# Patient Record
Sex: Male | Born: 1975 | Race: Black or African American | Hispanic: No | Marital: Single | Smoking: Current every day smoker
Health system: Southern US, Community
[De-identification: ages and names within clinical notes are randomized; demographics above are authoritative.]

---

## 2016-06-24 ENCOUNTER — Emergency Department (HOSPITAL_BASED_OUTPATIENT_CLINIC_OR_DEPARTMENT_OTHER)
Admission: EM | Admit: 2016-06-24 | Discharge: 2016-06-24 | Disposition: A | Discharge status: Home / Self Care | Payer: Self-pay | Attending: Emergency Medicine | Admitting: Emergency Medicine

## 2016-06-24 ENCOUNTER — Encounter (HOSPITAL_BASED_OUTPATIENT_CLINIC_OR_DEPARTMENT_OTHER): Payer: Self-pay | Admitting: *Deleted

## 2016-06-24 DIAGNOSIS — F17210 Nicotine dependence, cigarettes, uncomplicated: Secondary | ICD-10-CM | POA: Insufficient documentation

## 2016-06-24 DIAGNOSIS — R197 Diarrhea, unspecified: Secondary | ICD-10-CM | POA: Insufficient documentation

## 2016-06-24 DIAGNOSIS — R21 Rash and other nonspecific skin eruption: Secondary | ICD-10-CM | POA: Insufficient documentation

## 2016-06-24 LAB — CBG MONITORING, ED: GLUCOSE-CAPILLARY: 106 mg/dL — AB (ref 65–99)

## 2016-06-24 MED ORDER — ACETAMINOPHEN 325 MG PO TABS
650.0000 mg | ORAL_TABLET | Freq: Once | ORAL | Status: AC
  Administered 2016-06-24: 650 mg via ORAL
  Filled 2016-06-24: qty 2

## 2016-06-24 MED ORDER — TERBINAFINE HCL 250 MG PO TABS: 250.0000 mg | ORAL_TABLET | Freq: Every day | ORAL | 0 refills | Status: DC

## 2016-06-24 NOTE — ED Notes (Signed)
PA-C at bedside 

## 2016-06-24 NOTE — ED Triage Notes (Signed)
Pt reports he developed L foot fungus approx 3 wks ago and was treating it with athlete's foot cream on it. He also put yeast infection cream on it. Reports fungus has worsened and is on bil. Feet now. Denies fever, n/v/d. Reports clear drainage from area.

## 2016-06-24 NOTE — ED Provider Notes (Signed)
[facility]   CSN: [id number] Arrival date & time: 06/24/16  2130     History   Chief Complaint Chief Complaint  Patient presents with  . Skin Problem    bil feet    HPI Anthony Pope is a 40 y.o. male.  Anthony Pope is a 40 y.o. male with no pertinent medical history and new to the area presents to ED with complaint of b/l foot rash. He initially noticed rash approximately three weeks ago between his 4th and 5th digit of his left foot and it has progressively spread to the other interdigit spaces. He now reports a rash between his 4th and 5th digit of his right foot. Pain is severe enough that patient reports it is painful to walk. He reports associated clear discharge and some redness to the area. Prior to onset of rash patient states he was at a public pool. He also reports wearing socks and boots daily for work. He has tried OTC athlete's foot cream and yeast infection cream applied once daily for one week with minimal relief. He denies fever, N/V/D, numbness, or weakness. No h/o immuno-compromising conditions.      History reviewed. No pertinent past medical history.  There are no active problems to display for this patient.   History reviewed. No pertinent surgical history.     Home Medications    Prior to Admission medications   Medication Sig Start Date End Date Taking? Authorizing Provider  terbinafine (LAMISIL) 250 MG tablet Take 1 tablet (250 mg total) by mouth daily. 06/24/16   Lona Kettle, PA-C    Family History No family history on file.  Social History Social History  Substance Use Topics  . Smoking status: Current Every Day Smoker    Types: Cigarettes  . Smokeless tobacco: Never Used  . Alcohol use No     Allergies   Other   Review of Systems Review of Systems  Constitutional: Negative for fever.  Gastrointestinal: Positive for diarrhea, nausea and vomiting.  Musculoskeletal: Positive for gait  problem.  Skin: Positive for color change and rash.  Allergic/Immunologic: Negative for immunocompromised state.  Neurological: Negative for weakness and numbness.     Physical Exam Updated Vital Signs BP 127/97 (BP Location: Right Arm)   Pulse 62   Temp 98.4 F (36.9 C) (Oral)   Resp 14   Ht 6\' 1"  (1.854 m)   Wt 99.8 kg   SpO2 100%   BMI 29.03 kg/m   Physical Exam  Constitutional: He appears well-developed and well-nourished. No distress.  HENT:  Head: Normocephalic and atraumatic.  Eyes: Conjunctivae are normal. No scleral icterus.  Neck: Normal range of motion.  Cardiovascular: Normal rate, regular rhythm, normal heart sounds and intact distal pulses.   Pulmonary/Chest: Effort normal and breath sounds normal. No respiratory distress.  Abdominal: He exhibits no distension.  Musculoskeletal:  ROM, strength, sensation of ankles and feet intact b/l. 2+ DP pulses. Capillary refill <3 seconds.   Neurological: He is alert. He is not disoriented. GCS eye subscore is 4. GCS verbal subscore is 5. GCS motor subscore is 6.  Patient is able to ambulate.   Skin: Skin is warm and dry. He is not diaphoretic.  Maceration and scaling noted in interdigit web spaces of left foot. Mild TTP of plantar surface of left foot. No warmth, erythema, swelling, or purulent discharge appreciated. No streaking noted. No area of fluctuance.  Maceration of interdigit webspace of 4th/5th digit of right foot  noted. No warmth, erythema, swelling, or drainage noted.   Psychiatric: He has a normal mood and affect. His behavior is normal.     ED Treatments / Results  Labs (all labs ordered are listed, but only abnormal results are displayed) Labs Reviewed  CBG MONITORING, ED - Abnormal; Notable for the following:       Result Value   Glucose-Capillary 106 (*)    All other components within normal limits    EKG  EKG Interpretation None       Radiology No results found.  Procedures Procedures  (including critical care time)  Medications Ordered in ED Medications  acetaminophen (TYLENOL) tablet 650 mg (650 mg Oral Given 06/24/16 1040)     Initial Impression / Assessment and Plan / ED Course  I have reviewed the triage vital signs and the nursing notes.  Pertinent labs & imaging results that were available during my care of the patient were reviewed by me and considered in my medical decision making (see chart for details).  Clinical Course    Patient presents to ED rash on feet b/l. Patient is afebrile and non-toxic appearing in NAD. VSS. Concern for possible tinea infection. No evidence of secondary infection at this time. Neurovascularly intact. Will treat with PO anti-fungal medication. Pt instructed to keep the area dry. Contact precautions given. Follow up with PCP if sxs persist for possible referral to dermatology. Return precautions discussed. Patient voiced understanding and is agreeable. Pt is safe for discharge at this time.     Final Clinical Impressions(s) / ED Diagnoses   Final diagnoses:  Rash    New Prescriptions Discharge Medication List as of 06/24/2016 11:11 AM    START taking these medications   Details  terbinafine (LAMISIL) 250 MG tablet Take 1 tablet (250 mg total) by mouth daily., Starting Fri 06/24/2016, Print         Lona KettleAshley Laurel Asiah Browder, PA-C 06/24/16 1808    Melene Planan Floyd, DO 06/26/16 [id number]

## 2016-06-24 NOTE — Discharge Instructions (Signed)
Read the information below.  You are being prescribed an oral anti-fungal medication. Take as directed. You can continue topical antifungal twice daily to affected area.  It is very important to keep your feet clean and dry. Wash feet with warm soap and water. Dry thoroughly. Change socks regularly if they become wet. You can apply guaze between toes to help with moisture control.  You can take tylenol or motrin for pain relief.   Use the prescribed medication as directed.  Please discuss all new medications with your pharmacist.   It is important to establish a primary care doctor. I [facility]. Please call to schedule an appointment - you can talk with them regarding your elevated blood pressure and tobacco cessation.  If symptoms persist following treatment [facility], you may need a referral to dermatology.  You may return to the Emergency Department at any time for worsening condition or any new symptoms that concern you. Return to ED if you develop a fever or the area becomes red, swollen, have purulent drainage, or noticed red streaking up your leg.

## 2017-02-17 DIAGNOSIS — L03116 Cellulitis of left lower limb: Secondary | ICD-10-CM | POA: Insufficient documentation

## 2017-02-17 DIAGNOSIS — B353 Tinea pedis: Secondary | ICD-10-CM | POA: Insufficient documentation

## 2017-02-17 DIAGNOSIS — F17210 Nicotine dependence, cigarettes, uncomplicated: Secondary | ICD-10-CM | POA: Insufficient documentation

## 2017-02-18 ENCOUNTER — Encounter (HOSPITAL_COMMUNITY): Payer: Self-pay

## 2017-02-18 ENCOUNTER — Emergency Department (HOSPITAL_COMMUNITY)
Admission: EM | Admit: 2017-02-18 | Discharge: 2017-02-18 | Disposition: A | Discharge status: Home / Self Care | Payer: Self-pay | Attending: Emergency Medicine | Admitting: Emergency Medicine

## 2017-02-18 ENCOUNTER — Emergency Department (HOSPITAL_COMMUNITY): Payer: Self-pay

## 2017-02-18 DIAGNOSIS — B353 Tinea pedis: Secondary | ICD-10-CM

## 2017-02-18 DIAGNOSIS — L03116 Cellulitis of left lower limb: Secondary | ICD-10-CM

## 2017-02-18 DIAGNOSIS — M79672 Pain in left foot: Secondary | ICD-10-CM

## 2017-02-18 MED ORDER — CEPHALEXIN 250 MG PO CAPS
500.0000 mg | ORAL_CAPSULE | Freq: Once | ORAL | Status: AC
  Administered 2017-02-18: 500 mg via ORAL
  Filled 2017-02-18: qty 2

## 2017-02-18 MED ORDER — CEPHALEXIN 500 MG PO CAPS: 500.0000 mg | ORAL_CAPSULE | Freq: Four times a day (QID) | ORAL | 0 refills | Status: DC

## 2017-02-18 MED ORDER — TERBINAFINE HCL 1 % EX CREA: 1.0000 "application " | TOPICAL_CREAM | Freq: Two times a day (BID) | CUTANEOUS | 0 refills | Status: DC

## 2017-02-18 MED ORDER — NAPROXEN 250 MG PO TABS
500.0000 mg | ORAL_TABLET | Freq: Once | ORAL | Status: DC
  Filled 2017-02-18: qty 2

## 2017-02-18 NOTE — Discharge Instructions (Signed)
Please read and follow all provided instructions.  Your diagnoses today include:  1. Left foot pain   2. Cellulitis of left foot   3. Tinea pedis of left foot     Tests performed today include:  Vital signs. See below for your results today.   Medications prescribed:   Keflex (cephalexin) - antibiotic  You have been prescribed an antibiotic medicine: take the entire course of medicine even if you are feeling better. Stopping early can cause the antibiotic not to work.   Lamisil - topical medication for athlete's foot  Take any prescribed medications only as directed.   Home care instructions:  Follow any educational materials contained in this packet. Keep affected area above the level of your heart when possible. Wash area gently twice a day with warm soapy water. Do not apply alcohol or hydrogen peroxide. Cover the area if it draining or weeping.   Follow-up instructions: Please follow-up with your primary care provider in the next 1 week for further evaluation of your symptoms.   Return instructions:  Return to the Emergency Department if you have:  Fever  Worsening symptoms  Worsening pain  Worsening swelling  Redness of the skin that moves away from the affected area, especially if it streaks away from the affected area   Any other emergent concerns  Your vital signs today were: BP 126/73 (BP Location: Left Arm)    Pulse 75    Temp 97.6 F (36.4 C) (Oral)    Resp 18    SpO2 98%  If your blood pressure (BP) was elevated above 135/85 this visit, please have this repeated by your doctor within one month. --------------

## 2017-02-18 NOTE — ED Notes (Signed)
Pt went home without discharge paperwork or prescriptions, pt upset about not receiving pain medication, refused naproxen.

## 2017-02-18 NOTE — ED Provider Notes (Signed)
[facility] DEPT Provider Note   CSN: [id number] Arrival date & time: 02/17/17  2356     History   Chief Complaint Chief Complaint  Patient presents with  . Foot Pain    HPI Anthony Pope is a 41 y.o. male.  Patient presents with acute onset of left foot pain which started gradually and became much worse over the past 2 days. Patient has a small bump on his fourth toe. He has had some drainage from between his toes. He also states it has been draining from the wound on the fourth toe. Patient is unable to bear weight on his foot because of the pain. No history of gout or diabetes. No fevers. Pain is worse with movement.       History reviewed. No pertinent past medical history.  There are no active problems to display for this patient.   History reviewed. No pertinent surgical history.     Home Medications    Prior to Admission medications   Medication Sig Start Date End Date Taking? Authorizing Provider  terbinafine (LAMISIL) 250 MG tablet Take 1 tablet (250 mg total) by mouth daily. 06/24/16   Deborha Payment, PA-C    Family History History reviewed. No pertinent family history.  Social History Social History  Substance Use Topics  . Smoking status: Current Every Day Smoker    Types: Cigarettes  . Smokeless tobacco: Never Used  . Alcohol use No     Allergies   Other   Review of Systems Review of Systems  Constitutional: Negative for activity change.  Musculoskeletal: Positive for arthralgias. Negative for back pain, joint swelling and neck pain.  Skin: Positive for wound.  Neurological: Negative for weakness and numbness.     Physical Exam Updated Vital Signs BP 126/73 (BP Location: Left Arm)   Pulse 75   Temp 97.6 F (36.4 C) (Oral)   Resp 18   SpO2 98%   Physical Exam  Constitutional: He appears well-developed and well-nourished.  HENT:  Head: Normocephalic and atraumatic.  Eyes: Conjunctivae are normal.  Neck: Normal range of  motion. Neck supple.  Pulmonary/Chest: No respiratory distress.  Musculoskeletal:  Patient with small nondraining papule noted on the dorsum of the fourth toe. There is very light surrounding erythema on the toe and forefoot. Patient has a lot of pain with movement of the toes and palpation of this area. Mild tinea pedis noted between the toes. No active drainage or abscess noted.  Neurological: He is alert.  Skin: Skin is warm and dry.  Psychiatric: He has a normal mood and affect.  Nursing note and vitals reviewed.    ED Treatments / Results   Radiology Dg Foot Complete Left  Result Date: 02/18/2017 CLINICAL DATA:  41 year old male with left foot pain and swelling. Open wound at the fourth toe. EXAM: LEFT FOOT - COMPLETE 3+ VIEW COMPARISON:  None. FINDINGS: There is no acute fracture or dislocation. No bone erosion or periosteal reaction. No arthritic changes. The soft tissues appear unremarkable. No radiopaque foreign object or soft tissue gas. IMPRESSION: Negative. Electronically Signed   By: Elgie Collard M.D.   On: 02/18/2017 06:27    Procedures Procedures (including critical care time)  Medications Ordered in ED Medications  naproxen (NAPROSYN) tablet 500 mg (500 mg Oral Not Given 02/18/17 0622)  cephALEXin (KEFLEX) capsule 500 mg (500 mg Oral Given 02/18/17 4098)     Initial Impression / Assessment and Plan / ED Course  I have reviewed the triage  vital signs and the nursing notes.  Pertinent labs & imaging results that were available during my care of the patient were reviewed by me and considered in my medical decision making (see chart for details).     Patient seen and examined. No history of inflammatory arthritis. This may be a secondary cellulitis related to his athlete's foot. Given amount of pain out of proportion to exam, will check x-ray to ensure no gas in the soft tissues or fracture. Will treat with Keflex and naproxen in the emergency department.   Vital  signs reviewed and are as follows: BP 126/73 (BP Location: Left Arm)   Pulse 75   Temp 97.6 F (36.4 C) (Oral)   Resp 18   SpO2 98%   6:55 AM Called to room by RN after x-ray. Patient is angry that I ordered NSAID. States that he has been here for 8 hours and he wants more than ibuprofen. I attempted to calmly explain why an anti-inflammatory is needed for his current condition but he wouldn't allow me to explain and walked out of the room and down the hall. I offered him antibiotic for his infection and he stated he will wait for that.   Patient did not allow me to offer crutches/post-op shoe, check blood sugar to ensure no DM.   Pt urged to return with worsening pain, worsening swelling, expanding area of redness or streaking up extremity, fever, or any other concerns. Urged to take complete course of antibiotics as prescribed.  Pt verbalizes understanding and agrees with plan.   Final Clinical Impressions(s) / ED Diagnoses   Final diagnoses:  Left foot pain  Cellulitis of left foot  Tinea pedis of left foot   Patient with left foot swelling and pain. I suspect that this is most likely secondary infection from the tinea that he has. He will go home with Keflex and Lamisil. Low suspicion for necrotizing fasciitis, x-rays reassuring without tissue gas. Unfortunately, patient became very upset when I ordered NSAID for his inflammatory condition. He would not accept an explanation or other methods of pain control including crutches and postop shoe. He did allow me to print prescriptions and instructions for him.   New Prescriptions Discharge Medication List as of 02/18/2017  6:44 AM    START taking these medications   Details  cephALEXin (KEFLEX) 500 MG capsule Take 1 capsule (500 mg total) by mouth 4 (four) times daily., Starting Sat 02/18/2017, Print    terbinafine (LAMISIL AT) 1 % cream Apply 1 application topically 2 (two) times daily., Starting Sat 02/18/2017, Print         Renne Crigler, PA-C 02/18/17 0700    Pricilla Loveless, MD 02/18/17 2205

## 2017-02-18 NOTE — ED Triage Notes (Signed)
Pt complaining of L foot pain and swelling. Pt with open wound to L 4th toe. Pt states some pus drainage yesterday. Pt with skin breakdown between toes of L foot. Pt states first noticed yesterday.

## 2017-04-03 ENCOUNTER — Encounter (HOSPITAL_COMMUNITY): Payer: Self-pay | Admitting: Emergency Medicine

## 2017-04-03 ENCOUNTER — Emergency Department (HOSPITAL_COMMUNITY): Payer: Self-pay

## 2017-04-03 ENCOUNTER — Emergency Department (HOSPITAL_COMMUNITY)
Admission: EM | Admit: 2017-04-03 | Discharge: 2017-04-03 | Disposition: A | Discharge status: Home / Self Care | Payer: Self-pay | Attending: Emergency Medicine | Admitting: Emergency Medicine

## 2017-04-03 DIAGNOSIS — F17210 Nicotine dependence, cigarettes, uncomplicated: Secondary | ICD-10-CM | POA: Insufficient documentation

## 2017-04-03 DIAGNOSIS — R1032 Left lower quadrant pain: Secondary | ICD-10-CM | POA: Insufficient documentation

## 2017-04-03 LAB — URINALYSIS, ROUTINE W REFLEX MICROSCOPIC
Bilirubin Urine: NEGATIVE
GLUCOSE, UA: NEGATIVE mg/dL
Hgb urine dipstick: NEGATIVE
KETONES UR: NEGATIVE mg/dL
NITRITE: NEGATIVE
PROTEIN: 30 mg/dL — AB
Specific Gravity, Urine: 1.017 (ref 1.005–1.030)
pH: 7 (ref 5.0–8.0)

## 2017-04-03 LAB — GC/CHLAMYDIA PROBE AMP (~~LOC~~) NOT AT ARMC
CHLAMYDIA, DNA PROBE: NEGATIVE
Neisseria Gonorrhea: NEGATIVE

## 2017-04-03 LAB — BASIC METABOLIC PANEL
Anion gap: 6 (ref 5–15)
BUN: 6 mg/dL (ref 6–20)
CALCIUM: 9 mg/dL (ref 8.9–10.3)
CHLORIDE: 104 mmol/L (ref 101–111)
CO2: 27 mmol/L (ref 22–32)
CREATININE: 1.25 mg/dL — AB (ref 0.61–1.24)
GFR calc Af Amer: >60 mL/min (ref >60)
GFR calc non Af Amer: >60 mL/min (ref >60)
GLUCOSE: 111 mg/dL — AB (ref 65–99)
Potassium: 3.5 mmol/L (ref 3.5–5.1)
Sodium: 137 mmol/L (ref 135–145)

## 2017-04-03 LAB — CBC WITH DIFFERENTIAL/PLATELET
BASOS PCT: 0 %
Basophils Absolute: 0 10*3/uL (ref 0.0–0.1)
Eosinophils Absolute: 0.1 10*3/uL (ref 0.0–0.7)
Eosinophils Relative: 1 %
HEMATOCRIT: 39.4 % (ref 39.0–52.0)
HEMOGLOBIN: 13.7 g/dL (ref 13.0–17.0)
LYMPHS ABS: 1.7 10*3/uL (ref 0.7–4.0)
LYMPHS PCT: 17 %
MCH: 31.4 pg (ref 26.0–34.0)
MCHC: 34.8 g/dL (ref 30.0–36.0)
MCV: 90.4 fL (ref 78.0–100.0)
MONO ABS: 0.4 10*3/uL (ref 0.1–1.0)
MONOS PCT: 4 %
NEUTROS ABS: 8.1 10*3/uL — AB (ref 1.7–7.7)
NEUTROS PCT: 78 %
Platelets: 277 10*3/uL (ref 150–400)
RBC: 4.36 MIL/uL (ref 4.22–5.81)
RDW: 13.1 % (ref 11.5–15.5)
WBC: 10.3 10*3/uL (ref 4.0–10.5)

## 2017-04-03 MED ORDER — TRAMADOL HCL 50 MG PO TABS: 50.0000 mg | ORAL_TABLET | Freq: Four times a day (QID) | ORAL | 0 refills | Status: DC | PRN

## 2017-04-03 MED ORDER — KETOROLAC TROMETHAMINE 60 MG/2ML IM SOLN
60.0000 mg | Freq: Once | INTRAMUSCULAR | Status: AC
  Administered 2017-04-03: 60 mg via INTRAMUSCULAR
  Filled 2017-04-03: qty 2

## 2017-04-03 MED ORDER — CYCLOBENZAPRINE HCL 10 MG PO TABS: 10.0000 mg | ORAL_TABLET | Freq: Three times a day (TID) | ORAL | 0 refills | Status: DC | PRN

## 2017-04-03 MED ORDER — KETOROLAC TROMETHAMINE 30 MG/ML IJ SOLN
30.0000 mg | Freq: Once | INTRAMUSCULAR | Status: DC
  Filled 2017-04-03: qty 1

## 2017-04-03 MED ORDER — NAPROXEN 500 MG PO TABS: 500.0000 mg | ORAL_TABLET | Freq: Two times a day (BID) | ORAL | 0 refills | Status: DC

## 2017-04-03 MED ORDER — OXYCODONE-ACETAMINOPHEN 5-325 MG PO TABS
2.0000 | ORAL_TABLET | Freq: Once | ORAL | Status: AC
  Administered 2017-04-03: 2 via ORAL
  Filled 2017-04-03: qty 2
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
## 2017-04-20 ENCOUNTER — Encounter (HOSPITAL_COMMUNITY): Payer: Self-pay | Admitting: Emergency Medicine

## 2017-04-20 DIAGNOSIS — F17210 Nicotine dependence, cigarettes, uncomplicated: Secondary | ICD-10-CM | POA: Insufficient documentation

## 2017-04-20 DIAGNOSIS — R51 Headache: Secondary | ICD-10-CM | POA: Insufficient documentation

## 2017-04-20 MED ORDER — IBUPROFEN 400 MG PO TABS
400.0000 mg | ORAL_TABLET | Freq: Once | ORAL | Status: AC
  Administered 2017-04-20: 400 mg via ORAL

## 2017-04-20 MED ORDER — IBUPROFEN 400 MG PO TABS
ORAL_TABLET | ORAL | Status: AC
  Filled 2017-04-20: qty 1

## 2017-04-20 NOTE — ED Triage Notes (Signed)
Pt presents to ED for assessment of left head pain x 4 days, no relief with ibuprofen or narcotics at home.  Pt c/o pain radiating from nose up to above his left eyebrow, behind his left ear and down his neck.  Pt denies injury.  C/o dizziness when bending over or looking down.  No neuro deficits noted.

## 2017-04-21 ENCOUNTER — Emergency Department (HOSPITAL_COMMUNITY)
Admission: EM | Admit: 2017-04-21 | Discharge: 2017-04-21 | Disposition: A | Discharge status: Home / Self Care | Payer: Self-pay | Attending: Emergency Medicine | Admitting: Emergency Medicine

## 2017-04-21 ENCOUNTER — Encounter (HOSPITAL_COMMUNITY): Payer: Self-pay | Admitting: *Deleted

## 2017-04-21 ENCOUNTER — Emergency Department (HOSPITAL_COMMUNITY): Payer: Self-pay

## 2017-04-21 DIAGNOSIS — R51 Headache: Secondary | ICD-10-CM

## 2017-04-21 DIAGNOSIS — R519 Headache, unspecified: Secondary | ICD-10-CM

## 2017-04-21 MED ORDER — DIPHENHYDRAMINE HCL 50 MG/ML IJ SOLN
25.0000 mg | Freq: Once | INTRAMUSCULAR | Status: AC
  Administered 2017-04-21: 25 mg via INTRAVENOUS
  Filled 2017-04-21: qty 1

## 2017-04-21 MED ORDER — SODIUM CHLORIDE 0.9 % IV BOLUS (SEPSIS)
1000.0000 mL | Freq: Once | INTRAVENOUS | Status: AC
Start: 2017-04-21 — End: 2017-04-21
  Administered 2017-04-21: 1000 mL via INTRAVENOUS

## 2017-04-21 MED ORDER — METOCLOPRAMIDE HCL 5 MG/ML IJ SOLN
10.0000 mg | Freq: Once | INTRAMUSCULAR | Status: AC
  Administered 2017-04-21: 10 mg via INTRAVENOUS
  Filled 2017-04-21: qty 2

## 2017-04-21 MED ORDER — KETOROLAC TROMETHAMINE 15 MG/ML IJ SOLN
15.0000 mg | Freq: Once | INTRAMUSCULAR | Status: AC
  Administered 2017-04-21: 15 mg via INTRAVENOUS
  Filled 2017-04-21: qty 1

## 2017-04-21 NOTE — ED Notes (Signed)
Patient transported to CT 

## 2017-04-21 NOTE — ED Provider Notes (Signed)
[facility] Provider Note   CSN: [id number] Arrival date & time: 04/20/17  2336     History   Chief Complaint Chief Complaint  Patient presents with  . Headache    HPI Anthony Pope is a 41 y.o. male.  Patient presents to the emergency department with chief complaint of headache. He denies any other past medical history. He denies any history of headaches. He states this headache began about 4 days ago. It has been constant and daily.  It is worsened with exposure to light and loud noises. He describes a stabbing sensation behind his left eye. He denies any fevers, chills, vision changes, slurred speech, numbness, weakness, or tingling of his extremities. He has been taking OTC medications as well as some Percocet with no relief. There are no other associated symptoms or modifying factors.   The history is provided by the patient. No language interpreter was used.    History reviewed. No pertinent past medical history.  There are no active problems to display for this patient.   History reviewed. No pertinent surgical history.     Home Medications    Prior to Admission medications   Medication Sig Start Date End Date Taking? Authorizing Provider  cephALEXin (KEFLEX) 500 MG capsule Take 1 capsule (500 mg total) by mouth 4 (four) times daily. 02/18/17   Renne Crigler, PA-C  cyclobenzaprine (FLEXERIL) 10 MG tablet Take 1 tablet (10 mg total) by mouth 3 (three) times daily as needed for muscle spasms. 04/03/17   Gilda Crease, MD  naproxen (NAPROSYN) 500 MG tablet Take 1 tablet (500 mg total) by mouth 2 (two) times daily. 04/03/17   Gilda Crease, MD  terbinafine (LAMISIL AT) 1 % cream Apply 1 application topically 2 (two) times daily. 02/18/17   Renne Crigler, PA-C  terbinafine (LAMISIL) 250 MG tablet Take 1 tablet (250 mg total) by mouth daily. 06/24/16   Deborha Payment, PA-C  traMADol (ULTRAM) 50 MG tablet Take 1 tablet (50 mg total) by mouth every  6 (six) hours as needed. 04/03/17   Gilda Crease, MD    Family History History reviewed. No pertinent family history.  Social History Social History  Substance Use Topics  . Smoking status: Current Every Day Smoker    Packs/day: 0.50    Types: Cigarettes  . Smokeless tobacco: Never Used  . Alcohol use No     Allergies   Other   Review of Systems Review of Systems  All other systems reviewed and are negative.    Physical Exam Updated Vital Signs BP 106/78   Pulse 65   Temp 98.1 F (36.7 C) (Oral)   Resp 18   SpO2 98%   Physical Exam  Constitutional: He is oriented to person, place, and time. He appears well-developed and well-nourished.  HENT:  Head: Normocephalic and atraumatic.  Right Ear: External ear normal.  Left Ear: External ear normal.  Eyes: Conjunctivae and EOM are normal. Pupils are equal, round, and reactive to light. Right eye exhibits no discharge. Left eye exhibits no discharge. No scleral icterus.  Neck: Normal range of motion. Neck supple. No JVD present.  No pain with neck flexion, no meningismus  Cardiovascular: Normal rate, regular rhythm and normal heart sounds.  Exam reveals no gallop and no friction rub.   No murmur heard. Pulmonary/Chest: Effort normal and breath sounds normal. No respiratory distress. He has no wheezes. He has no rales. He exhibits no tenderness.  Abdominal: Soft. He exhibits no  distension and no mass. There is no tenderness. There is no rebound and no guarding.  Musculoskeletal: Normal range of motion. He exhibits no edema or tenderness.  Normal gait.  Neurological: He is alert and oriented to person, place, and time. He has normal reflexes.  CN 3-12 intact, normal finger to nose, no pronator drift, sensation and strength intact bilaterally.  Skin: Skin is warm and dry.  Psychiatric: He has a normal mood and affect. His behavior is normal. Judgment and thought content normal.  Nursing note and vitals  reviewed.    ED Treatments / Results  Labs (all labs ordered are listed, but only abnormal results are displayed) Labs Reviewed - No data to display  EKG  EKG Interpretation None       Radiology No results found.  Procedures Procedures (including critical care time)  Medications Ordered in ED Medications  ibuprofen (ADVIL,MOTRIN) 400 MG tablet (not administered)  ketorolac (TORADOL) 15 MG/ML injection 15 mg (not administered)  metoCLOPramide (REGLAN) injection 10 mg (not administered)  diphenhydrAMINE (BENADRYL) injection 25 mg (not administered)  sodium chloride 0.9 % bolus 1,000 mL (not administered)  ibuprofen (ADVIL,MOTRIN) tablet 400 mg (400 mg Oral Given 04/20/17 2351)     Initial Impression / Assessment and Plan / ED Course  I have reviewed the triage vital signs and the nursing notes.  Pertinent labs & imaging results that were available during my care of the patient were reviewed by me and considered in my medical decision making (see chart for details).     Pt HA treated and improved while in ED.  Presentation non concerning for [facility], ICH, Meningitis, or temporal arteritis.  CT ordered to rule out mass. Pt is afebrile with no focal neuro deficits, nuchal rigidity, or change in vision. Pt is to follow up with PCP to discuss prophylactic medication. Pt verbalizes understanding and is agreeable with plan to dc.    Final Clinical Impressions(s) / ED Diagnoses   Final diagnoses:  Acute nonintractable headache, unspecified headache type    New Prescriptions New Prescriptions   No medications on file     Roxy HorsemanBrowning, Calix Heinbaugh, Cordelia Poche-C 04/21/17 0446    Zadie RhineWickline, Donald, MD 04/21/17 [phone]

## 2017-04-21 NOTE — ED Notes (Signed)
Pt called for again for vital sign reassessment. No answer x3. 

## 2017-04-21 NOTE — ED Notes (Signed)
Pt called for in waiting area for vital sign reassessment. No answer x3. 

## 2017-04-23 ENCOUNTER — Encounter (HOSPITAL_COMMUNITY): Payer: Self-pay | Admitting: Emergency Medicine

## 2017-04-23 ENCOUNTER — Emergency Department (HOSPITAL_COMMUNITY)
Admission: EM | Admit: 2017-04-23 | Discharge: 2017-04-23 | Disposition: A | Discharge status: Home / Self Care | Payer: Self-pay | Attending: Emergency Medicine | Admitting: Emergency Medicine

## 2017-04-23 DIAGNOSIS — F17210 Nicotine dependence, cigarettes, uncomplicated: Secondary | ICD-10-CM | POA: Insufficient documentation

## 2017-04-23 DIAGNOSIS — N189 Chronic kidney disease, unspecified: Secondary | ICD-10-CM | POA: Insufficient documentation

## 2017-04-23 DIAGNOSIS — N289 Disorder of kidney and ureter, unspecified: Secondary | ICD-10-CM

## 2017-04-23 DIAGNOSIS — D649 Anemia, unspecified: Secondary | ICD-10-CM | POA: Insufficient documentation

## 2017-04-23 DIAGNOSIS — G43009 Migraine without aura, not intractable, without status migrainosus: Secondary | ICD-10-CM | POA: Insufficient documentation

## 2017-04-23 LAB — BASIC METABOLIC PANEL
Anion gap: 5 (ref 5–15)
BUN: 10 mg/dL (ref 6–20)
CALCIUM: 8.3 mg/dL — AB (ref 8.9–10.3)
CHLORIDE: 108 mmol/L (ref 101–111)
CO2: 24 mmol/L (ref 22–32)
CREATININE: 1.34 mg/dL — AB (ref 0.61–1.24)
GFR calc non Af Amer: >60 mL/min (ref >60)
Glucose, Bld: 100 mg/dL — ABNORMAL HIGH (ref 65–99)
Potassium: 3.5 mmol/L (ref 3.5–5.1)
SODIUM: 137 mmol/L (ref 135–145)

## 2017-04-23 LAB — CBC WITH DIFFERENTIAL/PLATELET
BASOS PCT: 0 %
Basophils Absolute: 0 10*3/uL (ref 0.0–0.1)
EOS ABS: 0.2 10*3/uL (ref 0.0–0.7)
EOS PCT: 2 %
HCT: 35.7 % — ABNORMAL LOW (ref 39.0–52.0)
Hemoglobin: 12 g/dL — ABNORMAL LOW (ref 13.0–17.0)
LYMPHS ABS: 2.1 10*3/uL (ref 0.7–4.0)
Lymphocytes Relative: 21 %
MCH: 30.5 pg (ref 26.0–34.0)
MCHC: 33.6 g/dL (ref 30.0–36.0)
MCV: 90.6 fL (ref 78.0–100.0)
Monocytes Absolute: 0.5 10*3/uL (ref 0.1–1.0)
Monocytes Relative: 5 %
NEUTROS PCT: 72 %
Neutro Abs: 7.2 10*3/uL (ref 1.7–7.7)
PLATELETS: 229 10*3/uL (ref 150–400)
RBC: 3.94 MIL/uL — AB (ref 4.22–5.81)
RDW: 13 % (ref 11.5–15.5)
WBC: 10.1 10*3/uL (ref 4.0–10.5)

## 2017-04-23 MED ORDER — PROCHLORPERAZINE EDISYLATE 5 MG/ML IJ SOLN
10.0000 mg | INTRAMUSCULAR | Status: AC
  Administered 2017-04-23: 10 mg via INTRAVENOUS
  Filled 2017-04-23: qty 2

## 2017-04-23 MED ORDER — SODIUM CHLORIDE 0.9 % IV BOLUS (SEPSIS)
1000.0000 mL | Freq: Once | INTRAVENOUS | Status: AC
Start: 2017-04-23 — End: 2017-04-23
  Administered 2017-04-23: 1000 mL via INTRAVENOUS

## 2017-04-23 MED ORDER — TETRACAINE HCL 0.5 % OP SOLN
2.0000 [drp] | Freq: Once | OPHTHALMIC | Status: AC
  Administered 2017-04-23: 2 [drp] via OPHTHALMIC
  Filled 2017-04-23: qty 4

## 2017-04-23 MED ORDER — KETOROLAC TROMETHAMINE 60 MG/2ML IM SOLN
60.0000 mg | Freq: Once | INTRAMUSCULAR | Status: AC
  Administered 2017-04-23: 60 mg via INTRAMUSCULAR
  Filled 2017-04-23: qty 2

## 2017-04-23 MED ORDER — PROCHLORPERAZINE MALEATE 10 MG PO TABS: 10.0000 mg | ORAL_TABLET | Freq: Four times a day (QID) | ORAL | 0 refills | Status: DC | PRN

## 2017-04-23 MED ORDER — PREDNISONE 20 MG PO TABS: ORAL_TABLET | ORAL | 0 refills | Status: DC

## 2017-04-23 MED ORDER — DIPHENHYDRAMINE HCL 50 MG/ML IJ SOLN
25.0000 mg | Freq: Once | INTRAMUSCULAR | Status: AC
  Administered 2017-04-23: 25 mg via INTRAVENOUS
  Filled 2017-04-23: qty 1

## 2017-04-23 MED ORDER — DEXAMETHASONE SODIUM PHOSPHATE 10 MG/ML IJ SOLN
10.0000 mg | Freq: Once | INTRAMUSCULAR | Status: AC
  Administered 2017-04-23: 10 mg via INTRAVENOUS
  Filled 2017-04-23: qty 1

## 2017-04-23 NOTE — ED Triage Notes (Signed)
Pt here 3 days ago for headache, headache gotten worse all in the L side to jaw, BP 172/80, Pulse 48, pt does have nausea and cannot keep food down.

## 2017-04-23 NOTE — ED Provider Notes (Signed)
[facility] Provider Note   CSN: [id number] Arrival date & time: 04/23/17  0016  By signing my name below, I, Phillips Climes, attest that this documentation has been prepared under the direction and in the presence of Dione Booze, MD . Electronically Signed: Phillips Climes, Scribe. 04/23/2017. 12:42 AM.  History   Chief Complaint Chief Complaint  Patient presents with  . Headache   HPI Comments  Anthony Pope is a 41 y.o. male with no reported PMHx, who presents to the Emergency Department with complaints of a constant headache, L>R x3 days.  He rates this a 10/10 in severity.  Associated photophobia, nausea and vomiting.  Reported left eye with blurry vision.  Pt states that he "does not believe this is a headache."  Trouble sleeping 2/2 discomfort.  He experienced no symptomatic relief with naproxen or tylenol.  Pt was seen with identical sx and normal CT head on 04/21/2017, x2 days ago.  He reports outpatient f/u with neurology already scheduled, however he is unable to recall any details.  Per chart, pt to schedule f/u with [facility].  He denies experiencing any other acute sx.  The history is provided by the patient and medical records. No language interpreter was used.    History reviewed. No pertinent past medical history.  There are no active problems to display for this patient.   History reviewed. No pertinent surgical history.     Home Medications    Prior to Admission medications   Medication Sig Start Date End Date Taking? Authorizing Provider  cephALEXin (KEFLEX) 500 MG capsule Take 1 capsule (500 mg total) by mouth 4 (four) times daily. 02/18/17   Renne Crigler, PA-C  cyclobenzaprine (FLEXERIL) 10 MG tablet Take 1 tablet (10 mg total) by mouth 3 (three) times daily as needed for muscle spasms. 04/03/17   Gilda Crease, MD  naproxen (NAPROSYN) 500 MG tablet Take 1 tablet (500 mg total) by mouth 2 (two) times daily. 04/03/17    Gilda Crease, MD  terbinafine (LAMISIL AT) 1 % cream Apply 1 application topically 2 (two) times daily. 02/18/17   Renne Crigler, PA-C  terbinafine (LAMISIL) 250 MG tablet Take 1 tablet (250 mg total) by mouth daily. 06/24/16   Deborha Payment, PA-C  traMADol (ULTRAM) 50 MG tablet Take 1 tablet (50 mg total) by mouth every 6 (six) hours as needed. 04/03/17   Gilda Crease, MD    Family History No family history on file.  Social History Social History  Substance Use Topics  . Smoking status: Current Every Day Smoker    Packs/day: 0.50    Types: Cigarettes  . Smokeless tobacco: Never Used  . Alcohol use No     Allergies   Other   Review of Systems Review of Systems  Eyes: Positive for photophobia and visual disturbance.  Gastrointestinal: Positive for nausea and vomiting.  Neurological: Positive for headaches.  All other systems reviewed and are negative.  Physical Exam Updated Vital Signs BP (!) 130/92 (BP Location: Right Arm)   Pulse (!) 57   Temp 98.4 F (36.9 C) (Oral)   Resp 16   Ht 6\' 1"  (1.854 m)   Wt 215 lb (97.5 kg)   SpO2 99%   BMI 28.37 kg/m   Physical Exam  Constitutional: He is oriented to person, place, and time. He appears well-developed and well-nourished.  Appears uncomfortable.  HENT:  Head: Normocephalic and atraumatic.  Eyes: EOM are normal. Pupils are equal,  round, and reactive to light.  Photophobia present.  Neck: Normal range of motion. Neck supple. No JVD present.  Cardiovascular: Normal rate, regular rhythm and normal heart sounds.   No murmur heard. Pulmonary/Chest: Effort normal and breath sounds normal. He has no wheezes. He has no rales. He exhibits no tenderness.  Abdominal: Soft. Bowel sounds are normal. He exhibits no distension and no mass. There is no tenderness.  Musculoskeletal: Normal range of motion. He exhibits no edema.  Lymphadenopathy:    He has no cervical adenopathy.  Neurological: He is alert and  oriented to person, place, and time. No cranial nerve deficit. He exhibits normal muscle tone. Coordination normal.  Skin: Skin is warm and dry. No rash noted.  Psychiatric: He has a normal mood and affect. His behavior is normal. Judgment and thought content normal.  Nursing note and vitals reviewed.  ED Treatments / Results  DIAGNOSTIC STUDIES: Oxygen Saturation is 99% on room air, normal by my interpretation.    COORDINATION OF CARE: 12:27 AM Discussed treatment plan with pt at bedside and pt agreed to plan.  Labs (all labs ordered are listed, but only abnormal results are displayed) Labs Reviewed  BASIC METABOLIC PANEL  CBC WITH DIFFERENTIAL/PLATELET    EKG  EKG Interpretation None      Radiology Ct Head Wo Contrast  Result Date: 04/21/2017 CLINICAL DATA:  Headache for 4 days, behind the left thigh in extending into the neck. EXAM: CT HEAD WITHOUT CONTRAST TECHNIQUE: Contiguous axial images were obtained from the base of the skull through the vertex without intravenous contrast. COMPARISON:  None. FINDINGS: Brain: There is no intracranial hemorrhage, mass or evidence of acute infarction. There is no extra-axial fluid collection. Gray matter and white matter appear normal. Cerebral volume is normal for age. Brainstem and posterior fossa are unremarkable. The CSF spaces appear normal. Vascular: No hyperdense vessel or unexpected calcification. Skull: Normal. Negative for fracture or focal lesion. Sinuses/Orbits: 12 mm retention cyst at the medial aspect of the left maxillary sinus, incompletely imaged. The other paranasal sinuses are normal where seen. Other: None. IMPRESSION: Normal brain Electronically Signed   By: Ellery Plunk M.D.   On: 04/21/2017 04:22    Procedures Procedures (including critical care time)  Medications Ordered in ED Medications  sodium chloride 0.9 % bolus 1,000 mL (not administered)  prochlorperazine (COMPAZINE) injection 10 mg (not administered)    ketorolac (TORADOL) injection 60 mg (not administered)  diphenhydrAMINE (BENADRYL) injection 25 mg (not administered)  dexamethasone (DECADRON) injection 10 mg (not administered)  tetracaine (PONTOCAINE) 0.5 % ophthalmic solution 2 drop (not administered)     Initial Impression / Assessment and Plan / ED Course  I have reviewed the triage vital signs and the nursing notes.  Pertinent lab results that were available during my care of the patient were reviewed by me and considered in my medical decision making (see chart for details).  Hemicranial headache which certainly seems consistent with migraine headache. Review of old records shows that he had been in the ED 2 days ago with headache. Notes say that he had relief from headache, but patient states that he did not get relief. Patient states he's never had problems with headache, but a prior ED visit had headaches listed in his review of systems. He'll be given a migraine cocktail of normal saline, focal posterior in, ketorolac, diphenhydramine, and dexamethasone. Will also check ocular pressures to make sure that this is not glaucoma.  He had excellent relief of his  headache with above noted treatment. Ocular pressure is 20 mmHg, which is normal. He is discharged with prescriptions for prednisone and prochlorperazine. Of note, laboratory evaluation shows hemoglobin has decreased from 1 week ago. There is no history of bleeding and it isn't clear what this is from. Mild renal insufficiency is present - unchanged from baseline.  Final Clinical Impressions(s) / ED Diagnoses   Final diagnoses:  Migraine without aura and without status migrainosus, not intractable  Renal insufficiency  Normochromic normocytic anemia    New Prescriptions New Prescriptions   PREDNISONE (DELTASONE) 20 MG TABLET    2 tabs po daily x 4 days   PROCHLORPERAZINE (COMPAZINE) 10 MG TABLET    Take 1 tablet (10 mg total) by mouth every 6 (six) hours as needed for  nausea or vomiting (or headache).     Dione BoozeGlick, Ameah Chanda, MD 04/23/17 [phone]

## 2017-04-23 NOTE — Discharge Instructions (Signed)
You may take ibuprofen or naproxen as needed for headache.
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
## 2017-12-07 ENCOUNTER — Other Ambulatory Visit: Payer: Self-pay

## 2017-12-07 ENCOUNTER — Emergency Department (HOSPITAL_COMMUNITY)
Admission: EM | Admit: 2017-12-07 | Discharge: 2017-12-07 | Discharge status: Against Medical Advice | Payer: Self-pay | Attending: Emergency Medicine | Admitting: Emergency Medicine

## 2017-12-07 ENCOUNTER — Encounter (HOSPITAL_COMMUNITY): Payer: Self-pay | Admitting: Emergency Medicine

## 2017-12-07 DIAGNOSIS — F17210 Nicotine dependence, cigarettes, uncomplicated: Secondary | ICD-10-CM | POA: Insufficient documentation

## 2017-12-07 DIAGNOSIS — T782XXA Anaphylactic shock, unspecified, initial encounter: Secondary | ICD-10-CM | POA: Insufficient documentation

## 2017-12-07 DIAGNOSIS — L299 Pruritus, unspecified: Secondary | ICD-10-CM | POA: Insufficient documentation

## 2017-12-07 DIAGNOSIS — L509 Urticaria, unspecified: Secondary | ICD-10-CM | POA: Insufficient documentation

## 2017-12-07 DIAGNOSIS — R21 Rash and other nonspecific skin eruption: Secondary | ICD-10-CM | POA: Insufficient documentation

## 2017-12-07 DIAGNOSIS — Z5320 Procedure and treatment not carried out because of patient's decision for unspecified reasons: Secondary | ICD-10-CM | POA: Insufficient documentation

## 2017-12-07 LAB — I-STAT CHEM 8, ED
BUN: 11 mg/dL (ref 6–20)
CALCIUM ION: 1.2 mmol/L (ref 1.15–1.40)
CHLORIDE: 102 mmol/L (ref 101–111)
CREATININE: 1.2 mg/dL (ref 0.61–1.24)
Glucose, Bld: 106 mg/dL — ABNORMAL HIGH (ref 65–99)
HCT: 44 % (ref 39.0–52.0)
Hemoglobin: 15 g/dL (ref 13.0–17.0)
Potassium: 4 mmol/L (ref 3.5–5.1)
Sodium: 140 mmol/L (ref 135–145)
TCO2: 27 mmol/L (ref 22–32)

## 2017-12-07 LAB — CBC WITH DIFFERENTIAL/PLATELET
Basophils Absolute: 0 10*3/uL (ref 0.0–0.1)
Basophils Relative: 0 %
Eosinophils Absolute: 0.3 10*3/uL (ref 0.0–0.7)
Eosinophils Relative: 2 %
HEMATOCRIT: 44.5 % (ref 39.0–52.0)
HEMOGLOBIN: 14.7 g/dL (ref 13.0–17.0)
LYMPHS ABS: 3.5 10*3/uL (ref 0.7–4.0)
LYMPHS PCT: 32 %
MCH: 30.3 pg (ref 26.0–34.0)
MCHC: 33 g/dL (ref 30.0–36.0)
MCV: 91.8 fL (ref 78.0–100.0)
Monocytes Absolute: 0.7 10*3/uL (ref 0.1–1.0)
Monocytes Relative: 6 %
NEUTROS ABS: 6.5 10*3/uL (ref 1.7–7.7)
NEUTROS PCT: 60 %
Platelets: 268 10*3/uL (ref 150–400)
RBC: 4.85 MIL/uL (ref 4.22–5.81)
RDW: 13.5 % (ref 11.5–15.5)
WBC: 11 10*3/uL — ABNORMAL HIGH (ref 4.0–10.5)

## 2017-12-07 MED ORDER — SODIUM CHLORIDE 0.9 % IV BOLUS (SEPSIS)
1000.0000 mL | Freq: Once | INTRAVENOUS | Status: AC
  Administered 2017-12-07: 1000 mL via INTRAVENOUS

## 2017-12-07 MED ORDER — DIPHENHYDRAMINE HCL 50 MG/ML IJ SOLN
25.0000 mg | Freq: Once | INTRAMUSCULAR | Status: AC
  Administered 2017-12-07: 25 mg via INTRAVENOUS
  Filled 2017-12-07: qty 1

## 2017-12-07 MED ORDER — FAMOTIDINE 40 MG PO TABS: 40.0000 mg | ORAL_TABLET | Freq: Every day | ORAL | 0 refills | Status: DC

## 2017-12-07 MED ORDER — DIPHENHYDRAMINE HCL 25 MG PO TABS: 25.0000 mg | ORAL_TABLET | Freq: Four times a day (QID) | ORAL | 0 refills | Status: AC

## 2017-12-07 MED ORDER — EPINEPHRINE 0.3 MG/0.3ML IJ SOAJ
0.3000 mg | Freq: Once | INTRAMUSCULAR | Status: AC
  Administered 2017-12-07: 0.3 mg via INTRAMUSCULAR

## 2017-12-07 MED ORDER — FAMOTIDINE IN NACL 20-0.9 MG/50ML-% IV SOLN
20.0000 mg | Freq: Once | INTRAVENOUS | Status: AC
  Administered 2017-12-07: 20 mg via INTRAVENOUS
  Filled 2017-12-07: qty 50

## 2017-12-07 MED ORDER — EPINEPHRINE 0.3 MG/0.3ML IJ SOAJ
INTRAMUSCULAR | Status: AC
  Filled 2017-12-07: qty 0.3

## 2017-12-07 MED ORDER — EPINEPHRINE 0.3 MG/0.3ML IJ SOAJ
0.3000 mg | Freq: Once | INTRAMUSCULAR | Status: AC
  Administered 2017-12-07: 0.3 mg via INTRAMUSCULAR
  Filled 2017-12-07: qty 0.3

## 2017-12-07 MED ORDER — PREDNISONE 20 MG PO TABS: 60.0000 mg | ORAL_TABLET | Freq: Every day | ORAL | 0 refills | Status: AC

## 2017-12-07 MED ORDER — EPINEPHRINE 0.3 MG/0.3ML IJ SOAJ: 0.3000 mg | Freq: Once | INTRAMUSCULAR | 0 refills | Status: AC | PRN

## 2017-12-07 MED ORDER — METHYLPREDNISOLONE SODIUM SUCC 125 MG IJ SOLR
125.0000 mg | Freq: Once | INTRAMUSCULAR | Status: AC
  Administered 2017-12-07: 125 mg via INTRAVENOUS
  Filled 2017-12-07: qty 2

## 2017-12-07 NOTE — ED Provider Notes (Signed)
[facility] EMERGENCY DEPARTMENT Provider Note   CSN: [id number] Arrival date & time: 12/07/17  0435     History   Chief Complaint Chief Complaint  Patient presents with  . Allergic Reaction    HPI Anthony Pope is a 42 y.o. male.  The history is provided by the patient.  Allergic Reaction  Presenting symptoms: itching, rash and swelling   Presenting symptoms: no wheezing   Severity:  Severe Prior allergic episodes:  No prior episodes Context: not jewelry/metal and not nuts   Context comment:  Unknown Relieved by:  Nothing Worsened by:  Nothing Ineffective treatments:  Antihistamines Took 2 benadryl without relief and hives progressed to swelling of the lips, under the tongue and uvula.  No emesis, no lightheadedness.    History reviewed. No pertinent past medical history.  There are no active problems to display for this patient.   History reviewed. No pertinent surgical history.     Home Medications    Prior to Admission medications   Medication Sig Start Date End Date Taking? Authorizing Provider  predniSONE (DELTASONE) 20 MG tablet 2 tabs po daily x 4 days 04/23/17   Dione Booze, MD  prochlorperazine (COMPAZINE) 10 MG tablet Take 1 tablet (10 mg total) by mouth every 6 (six) hours as needed for nausea or vomiting (or headache). 04/23/17   Dione Booze, MD    Family History No family history on file.  Social History Social History   Tobacco Use  . Smoking status: Current Every Day Smoker    Packs/day: 0.50    Types: Cigarettes  . Smokeless tobacco: Never Used  Substance Use Topics  . Alcohol use: No  . Drug use: No     Allergies   Other   Review of Systems Review of Systems  HENT: Positive for facial swelling. Negative for voice change.   Respiratory: Negative for wheezing.   Gastrointestinal: Negative for abdominal pain.  Skin: Positive for itching and rash.  Neurological: Negative for light-headedness.  All other  systems reviewed and are negative.    Physical Exam Updated Vital Signs BP 125/86 (BP Location: Right Arm)   Pulse 71   Temp 98 F (36.7 C) (Oral)   Resp 18   Ht 6\' 1"  (1.854 m)   Wt 104.3 kg (230 lb)   SpO2 96%   BMI 30.34 kg/m   Physical Exam  Constitutional: He is oriented to person, place, and time. He appears well-developed and well-nourished.  HENT:  Head: Normocephalic and atraumatic.  Nose: Nose normal.  Swelling of the lips and philtrum, under the tongue and mild uvula swellling  Eyes: Conjunctivae are normal. Pupils are equal, round, and reactive to light.  Neck: Normal range of motion. Neck supple. No tracheal deviation present.  Cardiovascular: Normal rate, regular rhythm, normal heart sounds and intact distal pulses.  Pulmonary/Chest: No respiratory distress. He has no wheezes.  Abdominal: Soft. Bowel sounds are normal. He exhibits no mass. There is no tenderness. There is no guarding.  Musculoskeletal: Normal range of motion.  Neurological: He is alert and oriented to person, place, and time. He displays normal reflexes.  Skin: Skin is warm. Capillary refill takes less than 2 seconds. Rash noted. He is not diaphoretic.  Hives      ED Treatments / Results  Labs (all labs ordered are listed, but only abnormal results are displayed)  Results for orders placed or performed during the hospital encounter of 12/07/17  CBC with Differential/Platelet  Result Value  Ref Range   WBC 11.0 (H) 4.0 - 10.5 K/uL   RBC 4.85 4.22 - 5.81 MIL/uL   Hemoglobin 14.7 13.0 - 17.0 g/dL   HCT 09.844.5 11.939.0 - 14.752.0 %   MCV 91.8 78.0 - 100.0 fL   MCH 30.3 26.0 - 34.0 pg   MCHC 33.0 30.0 - 36.0 g/dL   RDW 82.913.5 56.211.5 - 13.015.5 %   Platelets 268 150 - 400 K/uL   Neutrophils Relative % 60 %   Neutro Abs 6.5 1.7 - 7.7 K/uL   Lymphocytes Relative 32 %   Lymphs Abs 3.5 0.7 - 4.0 K/uL   Monocytes Relative 6 %   Monocytes Absolute 0.7 0.1 - 1.0 K/uL   Eosinophils Relative 2 %   Eosinophils  Absolute 0.3 0.0 - 0.7 K/uL   Basophils Relative 0 %   Basophils Absolute 0.0 0.0 - 0.1 K/uL  I-stat chem 8, ed  Result Value Ref Range   Sodium 140 135 - 145 mmol/L   Potassium 4.0 3.5 - 5.1 mmol/L   Chloride 102 101 - 111 mmol/L   BUN 11 6 - 20 mg/dL   Creatinine, Ser 8.651.20 0.61 - 1.24 mg/dL   Glucose, Bld 784106 (H) 65 - 99 mg/dL   Calcium, Ion 6.961.20 1.15 - 1.40 mmol/L   TCO2 27 22 - 32 mmol/L   Hemoglobin 15.0 13.0 - 17.0 g/dL   HCT 29.544.0 28.439.0 - 13.252.0 %   No results found.   Procedures Procedures (including critical care time)  Medications Ordered in ED  Medications  EPINEPHrine (EPI-PEN) injection 0.3 mg (0.3 mg Intramuscular Given 12/07/17 0505)  methylPREDNISolone sodium succinate (SOLU-MEDROL) 125 mg/2 mL injection 125 mg (125 mg Intravenous Given 12/07/17 0516)  famotidine (PEPCID) IVPB 20 mg premix (0 mg Intravenous Stopped 12/07/17 0541)  sodium chloride 0.9 % bolus 1,000 mL (0 mLs Intravenous Stopped 12/07/17 0545)  diphenhydrAMINE (BENADRYL) injection 25 mg (25 mg Intravenous Given 12/07/17 0516)  EPINEPHrine (EPI-PEN) injection 0.3 mg (0.3 mg Intramuscular Given 12/07/17 0605)    MDM Reviewed: nursing note and vitals Interpretation: labs (normal creatinine and blood count) Total time providing critical care: 75-105 minutes. This excludes time spent performing separately reportable procedures and services.   CRITICAL CARE Performed by: Jasmine AwePALUMBO-RASCH,Hinata Diener K Total critical care time: 75  minutes Critical care time was exclusive of separately billable procedures and treating other patients. Critical care was necessary to treat or prevent imminent or life-threatening deterioration. Critical care was time spent personally by me on the following activities: development of treatment plan with patient and/or surrogate as well as nursing, discussions with consultants, evaluation of patient's response to treatment, examination of patient, obtaining history from patient or  surrogate, ordering and performing treatments and interventions, ordering and review of laboratory studies, ordering and review of radiographic studies, pulse oximetry and re-evaluation of patient's condition.  Final Clinical Impressions(s) / ED Diagnoses  Angioedema:  Will admit for IV steroids    Dicie Edelen, MD 12/07/17 [phone]

## 2017-12-07 NOTE — ED Notes (Signed)
Pt verbalizes understanding of prescriptions and paperwork, pt verbalizes understanding of AMA form.

## 2017-12-07 NOTE — Discharge Instructions (Signed)
As we talked about, I highly recommend that you stay for admission to the hospital. Leaving now could result in severe worsening of your reaction causing you to stop breathing, go into shock, and likely die. Leaving now puts you at risk of this getting worse, which could get worse quickly before EMS/911 is able to help you. As we talked about, if you change your mind return to the ER immediately for admission.  You have been seen in the Emergency Department (ED) today for an allergic reaction.  You have been stable throughout your stay in the Emergency Department.  Please take your medications as prescribed and follow up with your doctor as indicated.  You should also take over-the-counter Benadryl around the clock for the next three days according to the dosing instructions on the package.  Please keep your Epi-Pen with you at all times and use it if experience shortness of breath or difficulty breathing or if you believe you are having a severe allergic reaction.  If you use the Epi-Pen, though, please call 911 afterwards or go immediately to your nearest Emergency Department.  Return to the Emergency Department (ED) if you experience any worsening or new symptoms that concern you.

## 2017-12-07 NOTE — ED Provider Notes (Signed)
Patient is awake and alert and has decision making capacity to refuse care.  Will 2 ED attending presents EDP explained that the risks of signing AMA are but are not limited to: Death, respiratory arrest secondary to airway closure from allergic reaction, coma, worsening angioedema, and prolonged morbidity from same.  He is able to verbalize understanding of all the risk and will sign out against medical advice.  He is welcome to return at any time.  RX for steroids and epi pen to be given.      Noella Kipnis, MD 12/07/17 [id number]

## 2017-12-07 NOTE — ED Notes (Signed)
2nd dose of Epi-pen injected at right lateral thigh , respirations unlabored , IV site intact , respirations unlabored /airway intact , swelling upper lip continues /itching relieved.

## 2017-12-07 NOTE — ED Triage Notes (Signed)
Pt states he had hives all over his body last night prior to go to sleep today he woke up with his top lip swollen, pt states he took 2 Benadryl last night with no relief. No new food or any other medication that he is aware, no respiratory distress on triage.

## 2017-12-07 NOTE — ED Notes (Signed)
Patient sleeping with no distress, respirations unlabored , NS IV bolus and Pepcid IV infusing , IV site intact , upper lip swelling and skin hives prevails .

## 2017-12-15 ENCOUNTER — Other Ambulatory Visit: Payer: Self-pay

## 2017-12-15 ENCOUNTER — Emergency Department (HOSPITAL_COMMUNITY)
Admission: EM | Admit: 2017-12-15 | Discharge: 2017-12-15 | Disposition: A | Discharge status: Home / Self Care | Payer: Self-pay | Attending: Emergency Medicine | Admitting: Emergency Medicine

## 2017-12-15 ENCOUNTER — Encounter (HOSPITAL_COMMUNITY): Payer: Self-pay | Admitting: *Deleted

## 2017-12-15 DIAGNOSIS — F17210 Nicotine dependence, cigarettes, uncomplicated: Secondary | ICD-10-CM | POA: Insufficient documentation

## 2017-12-15 DIAGNOSIS — L509 Urticaria, unspecified: Secondary | ICD-10-CM | POA: Insufficient documentation

## 2017-12-15 DIAGNOSIS — N489 Disorder of penis, unspecified: Secondary | ICD-10-CM

## 2017-12-15 DIAGNOSIS — N509 Disorder of male genital organs, unspecified: Secondary | ICD-10-CM | POA: Insufficient documentation

## 2017-12-15 MED ORDER — PREDNISONE 20 MG PO TABS: 40.0000 mg | ORAL_TABLET | Freq: Every day | ORAL | 0 refills | Status: DC

## 2017-12-15 MED ORDER — DEXAMETHASONE SODIUM PHOSPHATE 10 MG/ML IJ SOLN
8.0000 mg | Freq: Once | INTRAMUSCULAR | Status: AC
  Administered 2017-12-15: 8 mg via INTRAMUSCULAR
  Filled 2017-12-15: qty 1

## 2017-12-15 MED ORDER — FAMOTIDINE 20 MG PO TABS: 20.0000 mg | ORAL_TABLET | Freq: Two times a day (BID) | ORAL | 0 refills | Status: DC

## 2017-12-15 MED ORDER — FAMOTIDINE 20 MG PO TABS
20.0000 mg | ORAL_TABLET | Freq: Once | ORAL | Status: AC
  Administered 2017-12-15: 20 mg via ORAL
  Filled 2017-12-15: qty 1

## 2017-12-15 MED ORDER — DIPHENHYDRAMINE HCL 25 MG PO TABS: 25.0000 mg | ORAL_TABLET | Freq: Four times a day (QID) | ORAL | 0 refills | Status: DC

## 2017-12-15 MED ORDER — DIPHENHYDRAMINE HCL 25 MG PO CAPS
25.0000 mg | ORAL_CAPSULE | Freq: Once | ORAL | Status: AC
  Administered 2017-12-15: 25 mg via ORAL
  Filled 2017-12-15: qty 1

## 2017-12-15 NOTE — Discharge Instructions (Signed)
Take 40 mg prednisone for the next 3 days, then take 20 mg daily for the following 3 days.  Please also take 25 mg Benadryl as needed.  20 mg of Pepcid twice a day.  Scab on your penis does not appear to be herpes.  Likely related to recent sloughing off of skin in the shower.  You may go to your primary care doctor to get blood work for herpes, should you decide to do this.   Return to the emergency department if you have trouble breathing, notice swelling in your face or lips, or have any new or worsening symptoms

## 2017-12-15 NOTE — ED Notes (Signed)
Declined W/C at D/C and was escorted to lobby by RN. 

## 2017-12-15 NOTE — ED Provider Notes (Incomplete)
  [facility]ERGENCY [facility] Provider Note   CSN: [id number] Arrival date & time: 12/15/17  [id number]     History   Chief Complaint Chief Complaint  Patient presents with  . Rash    HPI Anthony Pope is a 42 y.o. male.  HPI   Anthony Pope is a [age]yo male with a history of   History reviewed. No pertinent past medical history.  There are no active problems to display for this patient.   History reviewed. No pertinent surgical history.     Home Medications    Prior to Admission medications   Medication Sig Start Date End Date Taking? Authorizing Provider  diphenhydrAMINE (BENADRYL) 25 MG tablet Take 1 tablet (25 mg total) by mouth every 6 (six) hours for 5 days. 12/07/17 12/12/17  Shaune PollackIsaacs, Cameron, MD  EPINEPHrine 0.3 mg/0.3 mL IJ SOAJ injection Inject 0.3 mLs (0.3 mg total) into the muscle once as needed for up to 1 dose (anaphylaxis). 12/07/17   Shaune PollackIsaacs, Cameron, MD  famotidine (PEPCID) 40 MG tablet Take 1 tablet (40 mg total) by mouth daily for 5 days. 12/07/17 12/12/17  Shaune PollackIsaacs, Cameron, MD    Family History History reviewed. No pertinent family history.  Social History Social History   Tobacco Use  . Smoking status: Current Every Day Smoker    Packs/day: 0.50    Types: Cigarettes  . Smokeless tobacco: Never Used  Substance Use Topics  . Alcohol use: No  . Drug use: No     Allergies   Other   Review of Systems Review of Systems   Physical Exam Updated Vital Signs BP (!) 143/85 (BP Location: Right Arm)   Pulse 99   Temp 98.2 F (36.8 C) (Oral)   Resp 16   SpO2 98%   Physical Exam   ED Treatments / Results  Labs (all labs ordered are listed, but only abnormal results are displayed) Labs Reviewed - No data to display  EKG  EKG Interpretation None       Radiology No results found.  Procedures Procedures (including critical care time)  Medications Ordered in ED Medications - No data to display   Initial  Impression / Assessment and Plan / ED Course  I have reviewed the triage vital signs and the nursing notes.  Pertinent labs & imaging results that were available during my care of the patient were reviewed by me and considered in my medical decision making (see chart for details).     ***  Final Clinical Impressions(s) / ED Diagnoses   Final diagnoses:  None    ED Discharge Orders    None

## 2017-12-15 NOTE — ED Triage Notes (Signed)
Pt also requesting STD testing. 

## 2017-12-15 NOTE — ED Provider Notes (Signed)
[facility] EMERGENCY DEPARTMENT Provider Note   CSN: [id number] Arrival date & time: 12/15/17  7425     History   Chief Complaint Chief Complaint  Patient presents with  . Rash    HPI Anthony Pope is a 42 y.o. male.  HPI  Anthony Pope is a 42 year old male with no significant past medical history who presents to the emergency department for multiple complaints.    Hives Patient was seen in the emergency department last week for anaphylaxis in which he had angioedema and hives.  Was treated with two rounds of IM epinephrine, solumedrol, benadryl and had subsequent improvement.  Patient was discharged with prednisone taper.  He states that he initially had improvement, but noticed hives returned after finishing prednisone two days ago. Denies angioedema, facial swelling or shortness of breath.  States that he is unsure what he is allergic to, although in the past he thought it was related to almonds.     Penile scab Patient also states that some of the skin on the head of the penis scraped off while he was ejaculating in the shower several days ago.  States that there is a scab overlying the head of the penis.  He would like to be checked to make sure that he does not have herpes. Patient denies fevers, chills, headache, myalgias, penile discharge, testicular pain/swelling, dysuria, chest pain, abdominal pain, nausea/vomiting.  States that he is sexually active with one male partner.  History reviewed. No pertinent past medical history.  There are no active problems to display for this patient.   History reviewed. No pertinent surgical history.     Home Medications    Prior to Admission medications   Medication Sig Start Date End Date Taking? Authorizing Provider  diphenhydrAMINE (BENADRYL) 25 MG tablet Take 1 tablet (25 mg total) by mouth every 6 (six) hours for 5 days. 12/07/17 12/12/17  Shaune Pollack, MD  EPINEPHrine 0.3 mg/0.3 mL IJ SOAJ  injection Inject 0.3 mLs (0.3 mg total) into the muscle once as needed for up to 1 dose (anaphylaxis). 12/07/17   Shaune Pollack, MD  famotidine (PEPCID) 40 MG tablet Take 1 tablet (40 mg total) by mouth daily for 5 days. 12/07/17 12/12/17  Shaune Pollack, MD    Family History History reviewed. No pertinent family history.  Social History Social History   Tobacco Use  . Smoking status: Current Every Day Smoker    Packs/day: 0.50    Types: Cigarettes  . Smokeless tobacco: Never Used  Substance Use Topics  . Alcohol use: No  . Drug use: No     Allergies   Other   Review of Systems Review of Systems  Constitutional: Negative for chills and fever.  Respiratory: Negative for shortness of breath and wheezing.   Cardiovascular: Negative for chest pain.  Gastrointestinal: Negative for abdominal pain, nausea and vomiting.  Genitourinary: Negative for discharge, dysuria, flank pain, frequency, penile pain, scrotal swelling and testicular pain.  Musculoskeletal: Negative for arthralgias and myalgias.  Skin: Positive for rash (hives on bilateral arms, upper back and thighs) and wound (head of the penis).  Neurological: Negative for headaches.  Psychiatric/Behavioral: Negative for agitation.     Physical Exam Updated Vital Signs BP (!) 143/85 (BP Location: Right Arm)   Pulse 99   Temp 98.2 F (36.8 C) (Oral)   Resp 16   SpO2 98%   Physical Exam  Constitutional: He appears well-developed and well-nourished. No distress.  HENT:  Head: Normocephalic and  atraumatic.  No angioedema or facial swelling.  Mucous membranes moist.  Airway patent.  Eyes: Conjunctivae are normal. Pupils are equal, round, and reactive to light. Right eye exhibits no discharge. Left eye exhibits no discharge.  Neck: Normal range of motion. Neck supple.  Cardiovascular: Normal rate, regular rhythm and intact distal pulses. Exam reveals no friction rub.  No murmur heard. Pulmonary/Chest: Effort normal and  breath sounds normal. No stridor. No respiratory distress. He has no wheezes. He has no rales.  No respiratory distress. Speaking in full sentences. Lungs clear to auscultation.   Abdominal: Soft. Bowel sounds are normal. There is no tenderness. There is no guarding.  Genitourinary:  Genitourinary Comments: Chaperone present for exam. Small scab approximately .5cm in diameter on the head of the penis. No grouped vesicles. It is not tender to palpation. No surrounding warmth or erythema. No discharge from penis. The penis and testicles are nontender. No testicular masses or swelling.  Musculoskeletal: Normal range of motion.  Neurological: He is alert. Coordination normal.  Skin: Skin is warm and dry. Capillary refill takes less than 2 seconds. He is not diaphoretic.  Several raised erythematous wheals on the bilateral arms. One erythematous wheal on the upper back. Several wheals present on bilateral inner thighs.   Psychiatric: He has a normal mood and affect. His behavior is normal.  Nursing note and vitals reviewed.    ED Treatments / Results  Labs (all labs ordered are listed, but only abnormal results are displayed) Labs Reviewed - No data to display  EKG  EKG Interpretation None       Radiology No results found.  Procedures Procedures (including critical care time)  Medications Ordered in ED Medications  dexamethasone (DECADRON) injection 8 mg (8 mg Intramuscular Given 12/15/17 1205)  diphenhydrAMINE (BENADRYL) capsule 25 mg (25 mg Oral Given 12/15/17 1205)  famotidine (PEPCID) tablet 20 mg (20 mg Oral Given 12/15/17 1205)     Initial Impression / Assessment and Plan / ED Course  I have reviewed the triage vital signs and the nursing notes.  Pertinent labs & imaging results that were available during my care of the patient were reviewed by me and considered in my medical decision making (see chart for details).    Presents for multiple complaints.  Scab on  Penis Scab is likely due to sloughing off of the skin while ejaculating in the shower several days ago.  No grouped vesicles, it does not appear to be HSV.  It is nontender to palpation, no erythema or warmth to suggest balanitis. No testicular pain to suggest epididymitis. Have counseled patient that he can see his primary care provider for HSV testing on an outpatient basis.   Hives Patient has several hives on bilateral arms and on upper thighs.  They are pruritic.  No angioedema or facial swelling.  Vital signs stable.  No respiratory distress.  He was recently seen in the emergency department for similar symptoms a week ago and improved with p.o. steroids.  No known allergy trigger.  Have given patient Decadron IM in the emergency department.  Will discharge with prednisone taper, Benadryl and Pepcid.  Have given patient information to follow-up with an allergist.  He has a prescription for an EpiPen.  Discussed strict return precautions the patient agrees and voiced understanding to the above plan.  His blood pressure is elevated in the ER today, counseled him to have this rechecked.  Have discussed this patient with Dr. Adriana Simas who agrees with above plan  and discharge.   Final Clinical Impressions(s) / ED Diagnoses   Final diagnoses:  Hives  Lesion of penis    ED Discharge Orders        Ordered    predniSONE (DELTASONE) 20 MG tablet  Daily     12/15/17 1205    famotidine (PEPCID) 20 MG tablet  2 times daily     12/15/17 1205    diphenhydrAMINE (BENADRYL) 25 MG tablet  Every 6 hours     12/15/17 1205       Lawrence MarseillesShrosbree, Sunny Aguon J, PA-C 12/15/17 1714    Donnetta Hutchingook, Brian, MD 12/16/17 [phone]

## 2017-12-15 NOTE — ED Triage Notes (Signed)
Pt in c/o hives since last week, seen for same and started on prednisone, states he has completed his dose pack and the hives returned, no distress noted

## 2017-12-25 ENCOUNTER — Encounter (HOSPITAL_COMMUNITY): Payer: Self-pay

## 2017-12-25 ENCOUNTER — Other Ambulatory Visit: Payer: Self-pay

## 2017-12-25 ENCOUNTER — Emergency Department (HOSPITAL_COMMUNITY)
Admission: EM | Admit: 2017-12-25 | Discharge: 2017-12-26 | Disposition: A | Discharge status: Home / Self Care | Payer: Self-pay | Attending: Emergency Medicine | Admitting: Emergency Medicine

## 2017-12-25 DIAGNOSIS — R600 Localized edema: Secondary | ICD-10-CM | POA: Insufficient documentation

## 2017-12-25 DIAGNOSIS — Z5321 Procedure and treatment not carried out due to patient leaving prior to being seen by health care provider: Secondary | ICD-10-CM | POA: Insufficient documentation

## 2017-12-25 DIAGNOSIS — T7840XA Allergy, unspecified, initial encounter: Secondary | ICD-10-CM | POA: Insufficient documentation

## 2017-12-25 NOTE — ED Notes (Signed)
Called pt in lobby for vital recheck, no response x2.

## 2017-12-25 NOTE — ED Triage Notes (Signed)
Patient c/o an allergic reaction since this AM/0500. Patient states symptoms of hives and had a swollen upper lip. Patient states he took Benadryl 50 mg po at 1100 today. Patient states the swelling in his lip as decreased, but still has hives and is able to swallow, but feels like his breathing is "heavy." Room air sats-95%.

## 2017-12-25 NOTE — ED Notes (Signed)
Called pt in lobby no response, x1.

## 2017-12-25 NOTE — ED Notes (Signed)
Called pt in lobby, no response x3. 

## 2018-01-22 ENCOUNTER — Emergency Department (HOSPITAL_COMMUNITY): Admission: EM | Admit: 2018-01-22 | Discharge: 2018-01-22 | Discharge status: Absent without leave | Payer: Self-pay

## 2018-07-03 ENCOUNTER — Emergency Department (HOSPITAL_COMMUNITY): Payer: Self-pay

## 2018-07-03 ENCOUNTER — Encounter (HOSPITAL_COMMUNITY): Payer: Self-pay | Admitting: Emergency Medicine

## 2018-07-03 ENCOUNTER — Emergency Department (HOSPITAL_COMMUNITY)
Admission: EM | Admit: 2018-07-03 | Discharge: 2018-07-03 | Disposition: A | Discharge status: Home / Self Care | Payer: Self-pay | Attending: Emergency Medicine | Admitting: Emergency Medicine

## 2018-07-03 ENCOUNTER — Other Ambulatory Visit: Payer: Self-pay

## 2018-07-03 DIAGNOSIS — Z79899 Other long term (current) drug therapy: Secondary | ICD-10-CM | POA: Insufficient documentation

## 2018-07-03 DIAGNOSIS — M545 Low back pain, unspecified: Secondary | ICD-10-CM

## 2018-07-03 DIAGNOSIS — Y9389 Activity, other specified: Secondary | ICD-10-CM | POA: Insufficient documentation

## 2018-07-03 DIAGNOSIS — R202 Paresthesia of skin: Secondary | ICD-10-CM | POA: Insufficient documentation

## 2018-07-03 DIAGNOSIS — X500XXA Overexertion from strenuous movement or load, initial encounter: Secondary | ICD-10-CM | POA: Insufficient documentation

## 2018-07-03 DIAGNOSIS — Y990 Civilian activity done for income or pay: Secondary | ICD-10-CM | POA: Insufficient documentation

## 2018-07-03 DIAGNOSIS — F17210 Nicotine dependence, cigarettes, uncomplicated: Secondary | ICD-10-CM | POA: Insufficient documentation

## 2018-07-03 DIAGNOSIS — Y9289 Other specified places as the place of occurrence of the external cause: Secondary | ICD-10-CM | POA: Insufficient documentation

## 2018-07-03 MED ORDER — HYDROMORPHONE HCL 1 MG/ML IJ SOLN
1.0000 mg | Freq: Once | INTRAMUSCULAR | Status: AC
  Administered 2018-07-03: 1 mg via INTRAMUSCULAR
  Filled 2018-07-03: qty 1

## 2018-07-03 MED ORDER — IBUPROFEN 600 MG PO TABS: 600.0000 mg | ORAL_TABLET | Freq: Four times a day (QID) | ORAL | 0 refills | Status: AC | PRN

## 2018-07-03 MED ORDER — DIAZEPAM 2 MG PO TABS
2.0000 mg | ORAL_TABLET | Freq: Once | ORAL | Status: AC
  Administered 2018-07-03: 2 mg via ORAL
  Filled 2018-07-03: qty 1

## 2018-07-03 MED ORDER — METHOCARBAMOL 500 MG PO TABS
750.0000 mg | ORAL_TABLET | Freq: Once | ORAL | Status: AC
  Administered 2018-07-03: 750 mg via ORAL
  Filled 2018-07-03: qty 2

## 2018-07-03 MED ORDER — METHOCARBAMOL 750 MG PO TABS: 750.0000 mg | ORAL_TABLET | Freq: Two times a day (BID) | ORAL | 0 refills | Status: AC

## 2018-07-03 MED ORDER — KETOROLAC TROMETHAMINE 30 MG/ML IJ SOLN
30.0000 mg | Freq: Once | INTRAMUSCULAR | Status: AC
  Administered 2018-07-03: 30 mg via INTRAMUSCULAR
  Filled 2018-07-03: qty 1

## 2018-07-03 MED ORDER — HYDROCODONE-ACETAMINOPHEN 5-325 MG PO TABS: 1.0000 | ORAL_TABLET | Freq: Four times a day (QID) | ORAL | 0 refills | Status: DC | PRN

## 2018-07-03 NOTE — Discharge Instructions (Addendum)
Medications: Ibuprofen, Robaxin, Norco  Treatment: Take ibuprofen every 6 hours as prescribed.  Take Robaxin twice daily as prescribed.  For severe pain unresolved by Norco and Robaxin, you can take 1-2 Norco every 6 hours.  Use ice at least 3-4 times daily alternating 20 minutes on, 20 minutes off.  Attempt the back exercises and stretches as tolerated daily.  Make sure to stay moving, but avoid heavy lifting.  Try not to lie in bed all day as this will make your muscle spasms worse.  Follow-up: Please follow-up with your doctor early next week for recheck.  Please return the emergency department immediately if you develop any new or worsening symptoms including complete numbness of your legs or groin area, loss of bowel or bladder control, fever, or any other concerning symptom.  Do not drink alcohol, drive, operate machinery or participate in any other potentially dangerous activities while taking opiate pain medication as it may make you sleepy. Do not take this medication with any other sedating medications, either prescription or over-the-counter. If you were prescribed Percocet or Vicodin, do not take these with acetaminophen (Tylenol) as it is already contained within these medications and overdose of Tylenol is dangerous.   This medication is an opiate (or narcotic) pain medication and can be habit forming.  Use it as little as possible to achieve adequate pain control.  Do not use or use it with extreme caution if you have a history of opiate abuse or dependence. This medication is intended for your use only - do not give any to anyone else and keep it in a secure place where nobody else, especially children, have access to it. It will also cause or worsen constipation, so you may want to consider taking an over-the-counter stool softener while you are taking this medication.

## 2018-07-03 NOTE — ED Triage Notes (Signed)
Pt reports at work picking up 12 foot garage door when felt and heard pop. C/o lower back pain.

## 2018-07-03 NOTE — ED Notes (Signed)
Pt ambulated to bathroom without  Assistance.

## 2018-07-03 NOTE — ED Provider Notes (Addendum)
[facility]-EMERGENCY DEPT Provider Note   CSN: [id number] Arrival date & time: 07/03/18  1427     History   Chief Complaint Chief Complaint  Patient presents with  . Back Pain    HPI Anthony Pope is a 42 y.o. male who is previously healthy who presents with left-sided low back pain after lifting a metal pole while trying to put in a rational together at his job.  He reports something from the ground when he felt a pop in the left side of his back and he has numbness tingling shooting down his left leg.  It dropped him to the ground.  He does not have any numbness in his legs anymore, however he has been unable to straighten up or walk since.  No medications taken prior to arrival.  He has no history of back problems, cancer, history of procedure to back, IV drug use.  He denies any saddle anesthesia or bowel or bladder incontinence, urinary symptoms, fever, chest pain, shortness of breath, abdominal pain, nausea, vomiting.  HPI  History reviewed. No pertinent past medical history.  There are no active problems to display for this patient.   History reviewed. No pertinent surgical history.      Home Medications    Prior to Admission medications   Medication Sig Start Date End Date Taking? Authorizing Provider  EPINEPHrine 0.3 mg/0.3 mL IJ SOAJ injection Inject 0.3 mLs (0.3 mg total) into the muscle once as needed for up to 1 dose (anaphylaxis). 12/07/17  Yes Shaune Pollack, MD  diphenhydrAMINE (BENADRYL) 25 MG tablet Take 1 tablet (25 mg total) by mouth every 6 (six) hours for 5 days. 12/07/17 12/12/17  Shaune Pollack, MD  diphenhydrAMINE (BENADRYL) 25 MG tablet Take 1 tablet (25 mg total) by mouth every 6 (six) hours. Patient not taking: Reported on 07/03/2018 12/15/17   Kellie Shropshire, PA-C  famotidine (PEPCID) 20 MG tablet Take 1 tablet (20 mg total) by mouth 2 (two) times daily. Patient not taking: Reported on 07/03/2018 12/15/17   Kellie Shropshire, PA-C  famotidine (PEPCID) 40 MG tablet Take 1 tablet (40 mg total) by mouth daily for 5 days. 12/07/17 12/12/17  Shaune Pollack, MD  HYDROcodone-acetaminophen (NORCO/VICODIN) 5-325 MG tablet Take 1-2 tablets by mouth every 6 (six) hours as needed for severe pain. 07/03/18   Vasiliy Mccarry, Waylan Boga, PA-C  ibuprofen (ADVIL,MOTRIN) 600 MG tablet Take 1 tablet (600 mg total) by mouth every 6 (six) hours as needed. 07/03/18   Derrika Ruffalo, Waylan Boga, PA-C  methocarbamol (ROBAXIN) 750 MG tablet Take 1 tablet (750 mg total) by mouth 2 (two) times daily. 07/03/18   Marquett Bertoli, Waylan Boga, PA-C  predniSONE (DELTASONE) 20 MG tablet Take 2 tablets (40 mg total) by mouth daily. Take 40mg  for the next three days and then take 20mg  for three days Patient not taking: Reported on 07/03/2018 12/15/17   Kellie Shropshire, PA-C    Family History No family history on file.  Social History Social History   Tobacco Use  . Smoking status: Current Every Day Smoker    Packs/day: 0.50    Types: Cigarettes  . Smokeless tobacco: Never Used  Substance Use Topics  . Alcohol use: No  . Drug use: No     Allergies   Patient has no known allergies.   Review of Systems Review of Systems  Constitutional: Negative for chills and fever.  HENT: Negative for facial swelling and sore throat.   Respiratory: Negative for shortness  of breath.   Cardiovascular: Negative for chest pain.  Gastrointestinal: Negative for abdominal pain, nausea and vomiting.  Genitourinary: Negative for dysuria.  Musculoskeletal: Positive for back pain.  Skin: Negative for rash and wound.  Neurological: Positive for numbness (resolved). Negative for headaches.  Psychiatric/Behavioral: The patient is not nervous/anxious.      Physical Exam Updated Vital Signs BP 114/62   Pulse 68   Temp 98.5 F (36.9 C) (Oral)   Resp 15   Ht 6\' 1"  (1.854 m)   Wt 104.3 kg   SpO2 98%   BMI 30.34 kg/m   Physical Exam  Constitutional: He appears well-developed and  well-nourished. No distress.  HENT:  Head: Normocephalic and atraumatic.  Mouth/Throat: Oropharynx is clear and moist. No oropharyngeal exudate.  Eyes: Pupils are equal, round, and reactive to light. Conjunctivae are normal. Right eye exhibits no discharge. Left eye exhibits no discharge. No scleral icterus.  Neck: Normal range of motion. Neck supple. No thyromegaly present.  Cardiovascular: Normal rate, regular rhythm, normal heart sounds and intact distal pulses. Exam reveals no gallop and no friction rub.  No murmur heard. Pulmonary/Chest: Effort normal and breath sounds normal. No stridor. No respiratory distress. He has no wheezes. He has no rales.  Abdominal: Soft. Bowel sounds are normal. He exhibits no distension. There is no tenderness. There is no rebound and no guarding.  Musculoskeletal: He exhibits no edema.       Back:  Lymphadenopathy:    He has no cervical adenopathy.  Neurological: He is alert. Coordination normal.  5/5 strength to bilateral lower extremities, normal sensation, positive straight leg raise on the left, 2+ patellar reflexes bilaterally  Skin: Skin is warm and dry. No rash noted. He is not diaphoretic. No pallor.  Psychiatric: He has a normal mood and affect.  Nursing note and vitals reviewed.    ED Treatments / Results  Labs (all labs ordered are listed, but only abnormal results are displayed) Labs Reviewed - No data to display  EKG None  Radiology Dg Lumbar Spine Complete  Result Date: 07/03/2018 CLINICAL DATA:  Lifting injury.  Low back pain. EXAM: LUMBAR SPINE - COMPLETE 4+ VIEW COMPARISON:  CT 04/03/2017 FINDINGS: There is no evidence of lumbar spine fracture. Alignment is normal. Intervertebral disc spaces are maintained. IMPRESSION: Negative. Electronically Signed   By: Charlett Nose M.D.   On: 07/03/2018 16:36    Procedures Procedures (including critical care time)  Medications Ordered in ED Medications  ketorolac (TORADOL) 30 MG/ML  injection 30 mg (30 mg Intramuscular Given 07/03/18 1540)  diazepam (VALIUM) tablet 2 mg (2 mg Oral Given 07/03/18 1540)  HYDROmorphone (DILAUDID) injection 1 mg (1 mg Intramuscular Given 07/03/18 1713)  methocarbamol (ROBAXIN) tablet 750 mg (750 mg Oral Given 07/03/18 1713)     Initial Impression / Assessment and Plan / ED Course  I have reviewed the triage vital signs and the nursing notes.  Pertinent labs & imaging results that were available during my care of the patient were reviewed by me and considered in my medical decision making (see chart for details).  Clinical Course as of Jul 03 2018  Tue Jul 03, 2018  1800 On re-examination, patient's pain has decreased. Will attempt ambulation.   [AL]    Clinical Course User Index [AL] Emi Holes, PA-C    Patient presenting after injury to back.  Patient with normal neuro exam and after pain control with ice, IM Toradol, Dilaudid, and oral Valium and Robaxin, patient  is ambulatory in the ED.  He is feeling improved.  Patient has 5/5 stregnth in bilateral lower extremities.  Lumbar x-ray is negative.  Will discharge home with NSAIDs, Robaxin, and short course of Norco for severe pain.  I reviewed the [place] narcotic database and found no discrepancies.  I advised supportive treatment with ice and stretching.  Follow-up to his PCP if symptoms are not improving.  Patient advised to avoid heavy lifting this week.  Strict return precautions given.  Patient understands and agrees with plan.  Patient vitals stable throughout ED course and discharged in satisfactory condition.  Final Clinical Impressions(s) / ED Diagnoses   Final diagnoses:  Acute bilateral low back pain without sciatica    ED Discharge Orders         Ordered    HYDROcodone-acetaminophen (NORCO/VICODIN) 5-325 MG tablet  Every 6 hours PRN     07/03/18 1837    ibuprofen (ADVIL,MOTRIN) 600 MG tablet  Every 6 hours PRN     07/03/18 1837    methocarbamol (ROBAXIN) 750 MG tablet  2  times daily     07/03/18 1837              Emi Holes, PA-C 07/03/18 2020    Melene Plan, DO 07/03/18 2344

## 2018-07-12 ENCOUNTER — Emergency Department (HOSPITAL_COMMUNITY)
Admission: EM | Admit: 2018-07-12 | Discharge: 2018-07-12 | Disposition: A | Discharge status: Home / Self Care | Payer: Self-pay | Attending: Emergency Medicine | Admitting: Emergency Medicine

## 2018-07-12 ENCOUNTER — Encounter (HOSPITAL_COMMUNITY): Payer: Self-pay | Admitting: Emergency Medicine

## 2018-07-12 DIAGNOSIS — L03116 Cellulitis of left lower limb: Secondary | ICD-10-CM | POA: Insufficient documentation

## 2018-07-12 DIAGNOSIS — F17210 Nicotine dependence, cigarettes, uncomplicated: Secondary | ICD-10-CM | POA: Insufficient documentation

## 2018-07-12 MED ORDER — HYDROCODONE-ACETAMINOPHEN 5-325 MG PO TABS: 1.0000 | ORAL_TABLET | ORAL | 0 refills | Status: DC | PRN

## 2018-07-12 MED ORDER — SULFAMETHOXAZOLE-TRIMETHOPRIM 800-160 MG PO TABS: 1.0000 | ORAL_TABLET | Freq: Two times a day (BID) | ORAL | 0 refills | Status: AC

## 2018-07-12 MED ORDER — HYDROCODONE-ACETAMINOPHEN 5-325 MG PO TABS
1.0000 | ORAL_TABLET | Freq: Once | ORAL | Status: AC
  Administered 2018-07-12: 1 via ORAL
  Filled 2018-07-12: qty 1

## 2018-07-12 NOTE — ED Triage Notes (Signed)
Pt reports that he had left thigh swelling, redness and pain x 3 days. Is scab so possible bite. Denies drainage.

## 2018-07-12 NOTE — Discharge Instructions (Addendum)
You were evaluated today for leg pain and swelling.  You have been diagnosed with cellulitis which is an infection in the skin. When we used the ultrasound on the area there was not an area to drain. Please use warm soaks to the area.   We have marked your skin with a marker. Please return to the ED if the redness or any streaking appears from the area. You will need to be re-evaluated in 2 days.   Return to the Ed sooner if any new or worsening symptoms such as: Contact a doctor if: You have a fever. Your symptoms do not get better after 1-2 days of treatment. Your bone or joint under the infected area starts to hurt after the skin has healed. Your infection comes back. This can happen in the same area or another area. You have a swollen bump in the infected area. You have new symptoms. You feel ill and also have muscle aches and pains. Get help right away if: Your symptoms get worse. You feel very sleepy. You throw up (vomit) or have watery poop (diarrhea) for a long time. There are red streaks coming from the infected area. Your red area gets larger. Your red area turns darker.

## 2018-07-12 NOTE — ED Notes (Signed)
Pt reports 10/10 pain to left thigh pressure. Pt has redness, warm to touch, and tender to touch.

## 2018-07-12 NOTE — ED Provider Notes (Signed)
[facility]-EMERGENCY DEPT Provider Note   CSN: [id number] Arrival date & time: 07/12/18  1318   History   Chief Complaint Chief Complaint  Patient presents with  . Leg Pain  . Leg Swelling    HPI Anthony Pope is a [age] y.o. male who presents for evaluation of left leg pain. Began 3 days ago. Pain has been worsening. Pain radiates around the lateral portion of his left thigh. States he noticed a "bug bite" to his left thigh, however did not see or feel an insect bite him. Area has become increasingly painful and red. Admits to swelling to the "insect bite." States he has not been able to wear pants because of the pain or the material rubbing on it. Was not able to go to work today because of the pain. Denies fever, chills, headache, warmth to the area, numbness or tingling to his lower extremities, decreased ROM to the knee, pain or warmth to the knee.  Denies aggravating or alleviating factors. Has tried Tylenol this morning for his pain without relief. NO know history of DVT or PE, no malignancy, no recent travel or recent surgery. HPI  History reviewed. No pertinent past medical history.  There are no active problems to display for this patient.   History reviewed. No pertinent surgical history.      Home Medications    Prior to Admission medications   Medication Sig Start Date End Date Taking? Authorizing Provider  diphenhydrAMINE (BENADRYL) 25 MG tablet Take 1 tablet (25 mg total) by mouth every 6 (six) hours for 5 days. 12/07/17 12/12/17  Shaune PollackIsaacs, Cameron, MD  EPINEPHrine 0.3 mg/0.3 mL IJ SOAJ injection Inject 0.3 mLs (0.3 mg total) into the muscle once as needed for up to 1 dose (anaphylaxis). Patient not taking: Reported on 07/12/2018 12/07/17   Shaune PollackIsaacs, Cameron, MD  HYDROcodone-acetaminophen (NORCO/VICODIN) 5-325 MG tablet Take 1 tablet by mouth every 4 (four) hours as needed. 07/12/18   Shay Bartoli A, PA-C  ibuprofen (ADVIL,MOTRIN) 600 MG tablet Take  1 tablet (600 mg total) by mouth every 6 (six) hours as needed. Patient not taking: Reported on 07/12/2018 07/03/18   Emi HolesLaw, Alexandra M, PA-C  methocarbamol (ROBAXIN) 750 MG tablet Take 1 tablet (750 mg total) by mouth 2 (two) times daily. Patient not taking: Reported on 07/12/2018 07/03/18   Emi HolesLaw, Alexandra M, PA-C  sulfamethoxazole-trimethoprim (BACTRIM DS,SEPTRA DS) 800-160 MG tablet Take 1 tablet by mouth 2 (two) times daily for 7 days. 07/12/18 07/19/18  Len Azeez A, PA-C    Family History No family history on file.  Social History Social History   Tobacco Use  . Smoking status: Current Every Day Smoker    Packs/day: 0.50    Types: Cigarettes  . Smokeless tobacco: Never Used  Substance Use Topics  . Alcohol use: No  . Drug use: No     Allergies   Patient has no known allergies.   Review of Systems Review of Systems  Constitutional: Negative for appetite change, chills, diaphoresis, fatigue and fever.  Respiratory: Negative.   Cardiovascular: Negative.   Gastrointestinal: Negative.   Musculoskeletal: Negative for gait problem, joint swelling and myalgias.       Left leg pain and swelling.  Skin:       Redness to left thigh.     Physical Exam Updated Vital Signs BP 134/83 (BP Location: Left Arm)   Pulse 99   Temp 99.2 F (37.3 C) (Oral)   Resp 19   SpO2 97%  Physical Exam  Constitutional: Vital signs are normal. He appears well-developed and well-nourished.  Non-toxic appearance. He does not have a sickly appearance. He does not appear ill. No distress.  HENT:  Head: Atraumatic.  Eyes: Pupils are equal, round, and reactive to light.  Neck: Normal range of motion. Neck supple.  Cardiovascular: Normal rate, regular rhythm, normal heart sounds and intact distal pulses.  No murmur heard. Pulmonary/Chest: Effort normal and breath sounds normal. No stridor. No respiratory distress. He has no wheezes.  Abdominal: Soft. Bowel sounds are normal. He exhibits no  distension.  Musculoskeletal: Normal range of motion.       Left hip: Normal.       Left knee: He exhibits normal range of motion, no swelling, no effusion, no ecchymosis, no deformity, no laceration, no erythema, normal alignment and no bony tenderness.  Tenderness to palpation to the left anterior thigh.   Lymphadenopathy:       Left: No inguinal adenopathy present.  Neurological: He is alert. He has normal strength.  Skin: Skin is warm and dry. He is not diaphoretic.  Area of punctate spot without fluctuance. Area of induration of 4cmx4cm. Area of erythema approximately 6 marked. No streaking. No warmth.   Psychiatric: He has a normal mood and affect.  Nursing note and vitals reviewed.    ED Treatments / Results  Labs (all labs ordered are listed, but only abnormal results are displayed) Labs Reviewed - No data to display  EKG None  Radiology No results found.  Procedures Procedures (including critical care time)  Medications Ordered in ED Medications  HYDROcodone-acetaminophen (NORCO/VICODIN) 5-325 MG per tablet 1 tablet (1 tablet Oral Given 07/12/18 1421)     Initial Impression / Assessment and Plan / ED Course  I have reviewed the triage vital signs and the nursing notes as well as past medical history.  Pertinent labs & imaging results that were available during my care of the patient were reviewed by me and considered in my medical decision making (see chart for details).  42 year old male who is otherwise health presents for evaluation of worsening left pain and focal swelling. Possible insect bite to anterior distal left thigh however no known bite. Low suspicion for joint involvement given full ROM, no edema or erythema to left knee. Neurovascularly intact. Low suspicion for DVT given history and exam.  Bedside ultrasound without area of fluctuance with no area to drain. Patient is healthy, non- immunocompromised, Afebrile and appears non-ill and nonseptic.  Discussed with patient warm soaks and strict return precautions. Will need follow up in 2 days for re-evaluation. Mild signs of cellulitis with surrounding skin.  Will d/c to home with antibiotics. States he cannot take NSAIDs for pain as he gets gastritis with use. Already taking Tylenol without relief, Will dc with #3 norco. I have looked patient up in the PMP aware database and he has no active narcotic prescriptions. Patient voices understanding and is agreeable for follow-up.    Final Clinical Impressions(s) / ED Diagnoses   Final diagnoses:  Cellulitis of left lower extremity    ED Discharge Orders         Ordered    sulfamethoxazole-trimethoprim (BACTRIM DS,SEPTRA DS) 800-160 MG tablet  2 times daily     07/12/18 1422    HYDROcodone-acetaminophen (NORCO/VICODIN) 5-325 MG tablet  Every 4 hours PRN     07/12/18 1436           Luisfelipe Engelstad A, PA-C 07/12/18 1459  Gerhard Munch, MD 07/12/18 1540

## 2018-07-16 ENCOUNTER — Encounter: Payer: Self-pay | Admitting: Pediatric Intensive Care

## 2018-07-16 NOTE — Congregational Nurse Program (Signed)
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 States that he was evaluated at the ED 3 days ago for a "spider bite" but the pain and swelling associated with the bite is worsening despite taking prescribed antibiotics. Client states that he has been having "cold sweats" and generalized body aches. There is a large area of erythema and edema over left thigh. There is a small (1cm) open area draining serous fluid. CN advises reevaluation in ED since erythemic area is enlarged from previously marked area despite antibiotics. Client states that he will return to ED after his work shift.

## 2018-07-17 ENCOUNTER — Encounter: Payer: Self-pay | Admitting: Pediatric Intensive Care

## 2018-07-23 ENCOUNTER — Emergency Department (HOSPITAL_COMMUNITY)
Admission: EM | Admit: 2018-07-23 | Discharge: 2018-07-23 | Disposition: A | Discharge status: Home / Self Care | Payer: Self-pay | Attending: Emergency Medicine | Admitting: Emergency Medicine

## 2018-07-23 ENCOUNTER — Encounter (HOSPITAL_COMMUNITY): Payer: Self-pay | Admitting: Emergency Medicine

## 2018-07-23 DIAGNOSIS — L02416 Cutaneous abscess of left lower limb: Secondary | ICD-10-CM | POA: Insufficient documentation

## 2018-07-23 DIAGNOSIS — F17210 Nicotine dependence, cigarettes, uncomplicated: Secondary | ICD-10-CM | POA: Insufficient documentation

## 2018-07-23 DIAGNOSIS — L0291 Cutaneous abscess, unspecified: Secondary | ICD-10-CM

## 2018-07-23 DIAGNOSIS — L03116 Cellulitis of left lower limb: Secondary | ICD-10-CM | POA: Insufficient documentation

## 2018-07-23 MED ORDER — HYDROCODONE-ACETAMINOPHEN 5-325 MG PO TABS: 1.0000 | ORAL_TABLET | ORAL | 0 refills | Status: AC | PRN

## 2018-07-23 MED ORDER — IBUPROFEN 800 MG PO TABS
800.0000 mg | ORAL_TABLET | Freq: Once | ORAL | Status: AC
  Administered 2018-07-23: 800 mg via ORAL
  Filled 2018-07-23: qty 1

## 2018-07-23 MED ORDER — DOXYCYCLINE HYCLATE 100 MG PO CAPS: 100.0000 mg | ORAL_CAPSULE | Freq: Two times a day (BID) | ORAL | 0 refills | Status: AC

## 2018-07-23 MED ORDER — LIDOCAINE-EPINEPHRINE (PF) 2 %-1:200000 IJ SOLN
20.0000 mL | Freq: Once | INTRAMUSCULAR | Status: AC
  Administered 2018-07-23: 20 mL via INTRADERMAL
  Filled 2018-07-23: qty 20

## 2018-07-23 MED ORDER — LIDOCAINE-EPINEPHRINE 2 %-1:100000 IJ SOLN
20.0000 mL | Freq: Once | INTRAMUSCULAR | Status: DC
  Filled 2018-07-23: qty 20

## 2018-07-23 NOTE — ED Provider Notes (Signed)
[facility]-EMERGENCY DEPT Provider Note   CSN: [id number] Arrival date & time: 07/23/18  0751     History   Chief Complaint Chief Complaint  Patient presents with  . Abscess  . Wound Check    HPI Seneca Hoback is a 42 y.o. male otherwise healthy here presenting with left eye pain and swelling.  Patient was seen in the ED several days ago and had ultrasound that showed no abscess and was diagnosed with cellulitis and a course of Bactrim.  Patient states that he noticed some purulent drainage for the last 2 to 3 days.  Denies any fevers or chills. Denies history of diabetes. Denies IV drug use.   The history is provided by the patient.    History reviewed. No pertinent past medical history.  There are no active problems to display for this patient.   History reviewed. No pertinent surgical history.      Home Medications    Prior to Admission medications   Medication Sig Start Date End Date Taking? Authorizing Provider  diphenhydrAMINE (BENADRYL) 25 MG tablet Take 1 tablet (25 mg total) by mouth every 6 (six) hours for 5 days. 12/07/17 12/12/17  Shaune Pollack, MD  EPINEPHrine 0.3 mg/0.3 mL IJ SOAJ injection Inject 0.3 mLs (0.3 mg total) into the muscle once as needed for up to 1 dose (anaphylaxis). Patient not taking: Reported on 07/12/2018 12/07/17   Shaune Pollack, MD  HYDROcodone-acetaminophen (NORCO/VICODIN) 5-325 MG tablet Take 1 tablet by mouth every 4 (four) hours as needed. 07/12/18   Henderly, Britni A, PA-C  ibuprofen (ADVIL,MOTRIN) 600 MG tablet Take 1 tablet (600 mg total) by mouth every 6 (six) hours as needed. Patient not taking: Reported on 07/12/2018 07/03/18   Emi Holes, PA-C  methocarbamol (ROBAXIN) 750 MG tablet Take 1 tablet (750 mg total) by mouth 2 (two) times daily. Patient not taking: Reported on 07/12/2018 07/03/18   Emi Holes, PA-C    Family History No family history on file.  Social History Social History    Tobacco Use  . Smoking status: Current Every Day Smoker    Packs/day: 0.50    Types: Cigarettes  . Smokeless tobacco: Never Used  Substance Use Topics  . Alcohol use: No  . Drug use: No     Allergies   Patient has no known allergies.   Review of Systems Review of Systems  Skin: Positive for rash.  All other systems reviewed and are negative.    Physical Exam Updated Vital Signs BP (!) 134/91 (BP Location: Right Arm)   Pulse 74   Temp 98.3 F (36.8 C) (Oral)   Resp 20   SpO2 100%   Physical Exam  Constitutional: He is oriented to person, place, and time. He appears well-developed.  HENT:  Head: Normocephalic.  Eyes: Pupils are equal, round, and reactive to light. Conjunctivae and EOM are normal.  Neck: Normal range of motion. Neck supple.  Cardiovascular: Normal rate, regular rhythm and normal heart sounds.  Pulmonary/Chest: Effort normal and breath sounds normal. No stridor. No respiratory distress.  Abdominal: Soft. Bowel sounds are normal. He exhibits no distension. There is no tenderness.  Musculoskeletal: Normal range of motion.  Neurological: He is alert and oriented to person, place, and time.  Skin: Skin is warm.  Lateral aspect of L thigh there is some redness and some fluctuance with some purulent drainage   Nursing note and vitals reviewed.    ED Treatments / Results  Labs (all  labs ordered are listed, but only abnormal results are displayed) Labs Reviewed - No data to display  EKG None  Radiology No results found.  Procedures Procedures (including critical care time)  INCISION AND DRAINAGE Performed by: Richardean Canal Consent: Verbal consent obtained. Risks and benefits: risks, benefits and alternatives were discussed Type: abscess  Body area: L thigh  Anesthesia: local infiltration  Incision was made with a scalpel.  Local anesthetic: lidocaine 2% with epinephrine  Anesthetic total: 10 ml  Complexity: complex Blunt dissection  to break up loculations  Drainage: purulent  Drainage amount: moderate   Packing material: none  Patient tolerance: Patient tolerated the procedure well with no immediate complications.     Medications Ordered in ED Medications  lidocaine-EPINEPHrine (XYLOCAINE W/EPI) 2 %-1:100000 (with pres) injection 20 mL (has no administration in time range)     Initial Impression / Assessment and Plan / ED Course  I have reviewed the triage vital signs and the nursing notes.  Pertinent labs & imaging results that were available during my care of the patient were reviewed by me and considered in my medical decision making (see chart for details).     Rayhan Groleau is a 42 y.o. male here with L thigh cellulitis and abscess. It is draining a little already. Afebrile, well appearing. Will perform I and D.   8:59 AM I and D performed with purulent discharge. Since he still has surrounding cellulitis and finished course of bactrim, will try doxycycline. Will bring him back in 2 days for wound check.    Final Clinical Impressions(s) / ED Diagnoses   Final diagnoses:  None    ED Discharge Orders    None       Charlynne Pander, MD 07/23/18 0900

## 2018-07-23 NOTE — ED Triage Notes (Signed)
Pt reports that he was here last week and since he doesn't have a PCP he was told to come back here to follow up for his leg abscess. Pt reports that he still has "cottage cheese drainage coming out of it and real hard".  Pt also states that he was told that he was to be out of work for 3 days last week and didn't get a work note when he was here and needs one.

## 2018-07-23 NOTE — Discharge Instructions (Signed)
Take motrin for pain.   Take vicodin for severe pain. Do NOT drive with it   Take doxycycline as prescribed.   Expect some drainage from the wound. Change dressing when it is soaked.   Return in 2 days for wound check. You can also see your doctor or follow up in urgent care in 2 days to recheck the wound   Return to ER sooner if you have fever, severe pain, worse redness, more drainage.

## 2018-07-31 NOTE — [facility]regational Nurse Program (Signed)
Client states he was not evaluated in ED yesterday. Edema around wound is significantly decreased. Wound is draining serous drainage. Client states the site is still painful but improved.
